# Patient Record
Sex: Male | Born: 1945 | Race: Black or African American | Hispanic: No | Marital: Married | Smoking: Former smoker
Health system: Southern US, Community
[De-identification: ages and names within clinical notes are randomized; demographics above are authoritative.]

## PROBLEM LIST (undated history)

## (undated) DIAGNOSIS — G4733 Obstructive sleep apnea (adult) (pediatric): Secondary | ICD-10-CM

## (undated) DIAGNOSIS — I10 Essential (primary) hypertension: Secondary | ICD-10-CM

## (undated) DIAGNOSIS — E669 Obesity, unspecified: Secondary | ICD-10-CM

## (undated) DIAGNOSIS — R112 Nausea with vomiting, unspecified: Secondary | ICD-10-CM

## (undated) DIAGNOSIS — M1990 Unspecified osteoarthritis, unspecified site: Secondary | ICD-10-CM

## (undated) DIAGNOSIS — Z9889 Other specified postprocedural states: Secondary | ICD-10-CM

## (undated) DIAGNOSIS — H409 Unspecified glaucoma: Secondary | ICD-10-CM

## (undated) DIAGNOSIS — C61 Malignant neoplasm of prostate: Secondary | ICD-10-CM

## (undated) HISTORY — PX: TONSILLECTOMY: SUR1361

## (undated) HISTORY — PX: PROSTATE BIOPSY: SHX241

## (undated) HISTORY — DX: Malignant neoplasm of prostate: C61

## (undated) HISTORY — DX: Obesity, unspecified: E66.9

## (undated) HISTORY — DX: Essential (primary) hypertension: I10

## (undated) HISTORY — DX: Obstructive sleep apnea (adult) (pediatric): G47.33

---

## 1979-05-12 DIAGNOSIS — R112 Nausea with vomiting, unspecified: Secondary | ICD-10-CM

## 1979-05-12 DIAGNOSIS — Z9889 Other specified postprocedural states: Secondary | ICD-10-CM

## 1979-05-12 HISTORY — DX: Other specified postprocedural states: Z98.890

## 1979-05-12 HISTORY — PX: OTHER SURGICAL HISTORY: SHX169

## 1979-05-12 HISTORY — DX: Nausea with vomiting, unspecified: R11.2

## 2000-04-17 ENCOUNTER — Encounter: Admission: RE | Admit: 2000-04-17 | Discharge: 2000-04-17 | Payer: Self-pay | Admitting: Family Medicine

## 2000-04-17 ENCOUNTER — Encounter: Payer: Self-pay | Admitting: Family Medicine

## 2003-06-23 ENCOUNTER — Emergency Department (HOSPITAL_COMMUNITY): Admission: EM | Admit: 2003-06-23 | Discharge: 2003-06-23 | Payer: Self-pay | Admitting: *Deleted

## 2003-07-13 ENCOUNTER — Encounter: Admission: RE | Admit: 2003-07-13 | Discharge: 2003-07-13 | Payer: Self-pay | Admitting: Family Medicine

## 2004-02-28 ENCOUNTER — Ambulatory Visit (HOSPITAL_BASED_OUTPATIENT_CLINIC_OR_DEPARTMENT_OTHER): Admission: RE | Admit: 2004-02-28 | Discharge: 2004-02-28 | Payer: Self-pay | Admitting: Family Medicine

## 2008-07-11 ENCOUNTER — Emergency Department (HOSPITAL_COMMUNITY): Admission: EM | Admit: 2008-07-11 | Discharge: 2008-07-11 | Payer: Self-pay | Admitting: Emergency Medicine

## 2011-06-12 LAB — CBC
Hemoglobin: 15.7
MCV: 90
RBC: 5.22
WBC: 7

## 2011-06-12 LAB — COMPREHENSIVE METABOLIC PANEL
ALT: 23
AST: 31
CO2: 26
Chloride: 98
Creatinine, Ser: 1.02
GFR calc Af Amer: >60
GFR calc non Af Amer: >60
Glucose, Bld: 129 — ABNORMAL HIGH
Sodium: 136
Total Bilirubin: 0.7

## 2011-06-12 LAB — URINALYSIS, ROUTINE W REFLEX MICROSCOPIC
Bilirubin Urine: NEGATIVE
Ketones, ur: 15 — AB
Nitrite: NEGATIVE
Protein, ur: 30 — AB
Urobilinogen, UA: 1

## 2011-06-12 LAB — DIFFERENTIAL
Basophils Absolute: 0.3 — ABNORMAL HIGH
Basophils Relative: 5 — ABNORMAL HIGH
Eosinophils Absolute: 0
Eosinophils Relative: 1

## 2011-06-12 LAB — LIPASE, BLOOD: Lipase: 18

## 2011-08-11 HISTORY — PX: COLONOSCOPY: SHX174

## 2013-08-03 ENCOUNTER — Other Ambulatory Visit: Payer: Self-pay | Admitting: Family Medicine

## 2013-08-03 DIAGNOSIS — Z136 Encounter for screening for cardiovascular disorders: Secondary | ICD-10-CM

## 2013-08-11 ENCOUNTER — Ambulatory Visit
Admission: RE | Admit: 2013-08-11 | Discharge: 2013-08-11 | Disposition: A | Discharge status: Home / Self Care | Payer: 59 | Source: Ambulatory Visit | Attending: Family Medicine | Admitting: Family Medicine

## 2013-08-11 ENCOUNTER — Ambulatory Visit: Payer: Self-pay

## 2013-08-11 DIAGNOSIS — Z136 Encounter for screening for cardiovascular disorders: Secondary | ICD-10-CM

## 2013-09-08 ENCOUNTER — Encounter: Payer: Self-pay | Admitting: Cardiology

## 2013-09-08 ENCOUNTER — Encounter: Payer: Self-pay | Admitting: *Deleted

## 2013-09-08 DIAGNOSIS — E669 Obesity, unspecified: Secondary | ICD-10-CM | POA: Insufficient documentation

## 2013-09-08 DIAGNOSIS — G4733 Obstructive sleep apnea (adult) (pediatric): Secondary | ICD-10-CM | POA: Insufficient documentation

## 2013-09-08 DIAGNOSIS — E7800 Pure hypercholesterolemia, unspecified: Secondary | ICD-10-CM | POA: Insufficient documentation

## 2013-09-08 DIAGNOSIS — I10 Essential (primary) hypertension: Secondary | ICD-10-CM | POA: Insufficient documentation

## 2013-09-09 ENCOUNTER — Encounter: Payer: Self-pay | Admitting: General Surgery

## 2013-09-29 ENCOUNTER — Ambulatory Visit: Payer: Self-pay | Admitting: Cardiology

## 2013-10-08 ENCOUNTER — Ambulatory Visit (INDEPENDENT_AMBULATORY_CARE_PROVIDER_SITE_OTHER): Payer: 59 | Admitting: Cardiology

## 2013-10-08 ENCOUNTER — Encounter: Payer: Self-pay | Admitting: Cardiology

## 2013-10-08 VITALS — BP 148/98 | HR 72 | Ht 68.5 in | Wt 209.0 lb

## 2013-10-08 DIAGNOSIS — I10 Essential (primary) hypertension: Secondary | ICD-10-CM

## 2013-10-08 DIAGNOSIS — E669 Obesity, unspecified: Secondary | ICD-10-CM

## 2013-10-08 NOTE — Progress Notes (Signed)
[place], [place],   [address] Phone: [phone] Fax:  [phone]  Date:  10/08/2013   ID:  Paul Kennedy, DOB 1946/05/04, MRN [id number]  PCP:  Lujean Amel, MD  Sleep Medicine:  Fransico Him, MD  History of Present Illness: Paul Kennedy is a 68 y.o. male with a history of OSA on CPAP, HTN and obesity who presents today for followup.  He is doing well with his CPAP therapy.  He tolerates the CPAP mask but feels the pressure is too high and it causes him to be unable to breathe out.  He is only using it every other day.  He feels rested in the am and has no daytime sleepiness.  He has not been getting much aerobic exercise lately.     Wt Readings from Last 3 Encounters:  10/08/13 209 lb (94.802 kg)  03/26/13 197 lb 6.4 oz (89.54 kg)     Past Medical History  Diagnosis Date  . Hypercholesteremia   . Obesity   . OSA (obstructive sleep apnea)     Mild OSA AHI 13.98/hr now on CPAP at 8cm h2O  . HTN (hypertension)     Current Outpatient Prescriptions  Medication Sig Dispense Refill  . amLODipine (NORVASC) 5 MG tablet Take 5 mg by mouth daily.      Marland Kitchen aspirin 81 MG tablet Take 81 mg by mouth daily.      . B Complex Vitamins (VITAMIN B COMPLEX PO) Take 1 tablet by mouth daily.      . Coenzyme Q10 (COQ10) 100 MG CAPS Take 1 capsule by mouth daily.      . hydrochlorothiazide (HYDRODIURIL) 25 MG tablet Take 25 mg by mouth daily.      . Multiple Vitamin (MULTIVITAMIN) capsule Take 1 capsule by mouth daily.      . NON FORMULARY GAMMA E TOCOPHEROL, ULTRA NATURAL PROSTATE, MAXI HAIR, BUFFERED VITAMIN C      . Omega-3 Fatty Acids (FISH OIL PO) Take 1 tablet by mouth daily.      . potassium chloride SA (K-DUR,KLOR-CON) 20 MEQ tablet Take 20 mEq by mouth 2 (two) times daily.      . TRAVATAN Z 0.004 % SOLN ophthalmic solution Place 1 drop into both eyes at bedtime.       Marland Kitchen VIAGRA 100 MG tablet Take 100 mg by mouth as needed.       . Vitamin D, Cholecalciferol, 1000 UNITS  TABS Take 1,000 Units by mouth daily.      . vitamin E 100 UNIT capsule Take 100 Units by mouth daily.       No current facility-administered medications for this visit.    Allergies:   No Known Allergies  Social History:  The patient  reports that he quit smoking about 27 years ago. His smoking use included Cigarettes. He smoked 0.00 packs per day. He does not have any smokeless tobacco history on file. He reports that he does not drink alcohol or use illicit drugs.   Family History:  The patient's family history includes Diabetes in his mother, sister, and sister; Hypertension in his mother and sister.   ROS:  Please see the history of present illness.      All other systems reviewed and negative.   PHYSICAL EXAM: VS:  BP 148/98  Pulse 72  Ht 5' 8.5" (1.74 m)  Wt 209 lb (94.802 kg)  BMI 31.31 kg/m2 Well nourished, well developed, in no acute distress HEENT: normal Neck: no  JVD Cardiac:  normal S1, S2; RRR; no murmur Lungs:  clear to auscultation bilaterally, no wheezing, rhonchi or rales Abd: soft, nontender, no hepatomegaly Ext: no edema Skin: warm and dry Neuro:  CNs 2-12 intact, no focal abnormalities noted       ASSESSMENT AND PLAN:  1. OSA on CPAP therapy and is tolerating well  - I will get a 2 week autotitration from 4 to 20cm H2O since he is complaining that his pressure is too high 2. HTN - elevated today  - amlodipine/HCTZ   - I have asked him to check his BP daily for a week and call with the results 3. Obesity   Followup with me in  6 months  Signed, Fransico Him, MD 10/08/2013 2:08 PM

## 2013-10-08 NOTE — Patient Instructions (Addendum)
Your physician recommends that you continue on your current medications as directed. Please refer to the Current Medication list given to you today.  We contacted [facility] and they will be calling you to get a download on you machine. We also sent the order in for a two week auto titration on your CPAP machine.  Your physician has requested that you regularly monitor and record your blood pressure readings at home. Please use the same machine at the same time of day to check your readings and record them for a week and call us with the results.   Your physician wants you to follow-up in: 6 Months with Dr Mallie Snooks will receive a reminder letter in the mail two months in advance. If you don't receive a letter, please call our office to schedule the follow-up appointment.

## 2013-11-19 ENCOUNTER — Encounter: Payer: Self-pay | Admitting: Cardiology

## 2013-11-27 ENCOUNTER — Encounter: Payer: Self-pay | Admitting: Cardiology

## 2013-11-30 ENCOUNTER — Other Ambulatory Visit: Payer: Self-pay | Admitting: General Surgery

## 2013-11-30 DIAGNOSIS — G4733 Obstructive sleep apnea (adult) (pediatric): Secondary | ICD-10-CM

## 2014-11-24 ENCOUNTER — Other Ambulatory Visit (HOSPITAL_COMMUNITY): Payer: Self-pay | Admitting: Urology

## 2014-11-24 DIAGNOSIS — C61 Malignant neoplasm of prostate: Secondary | ICD-10-CM

## 2014-12-03 ENCOUNTER — Encounter (HOSPITAL_COMMUNITY)
Admission: RE | Admit: 2014-12-03 | Discharge: 2014-12-03 | Disposition: A | Discharge status: Home / Self Care | Payer: 59 | Source: Ambulatory Visit | Attending: Urology | Admitting: Urology

## 2014-12-03 DIAGNOSIS — C61 Malignant neoplasm of prostate: Secondary | ICD-10-CM | POA: Diagnosis present

## 2014-12-03 MED ORDER — TECHNETIUM TC 99M MEDRONATE IV KIT
26.0000 | PACK | Freq: Once | INTRAVENOUS | Status: AC | PRN
  Administered 2014-12-03: 26 via INTRAVENOUS

## 2014-12-14 ENCOUNTER — Ambulatory Visit (HOSPITAL_COMMUNITY)
Admission: RE | Admit: 2014-12-14 | Discharge: 2014-12-14 | Disposition: A | Discharge status: Home / Self Care | Payer: 59 | Source: Ambulatory Visit | Attending: Urology | Admitting: Urology

## 2014-12-14 ENCOUNTER — Other Ambulatory Visit (HOSPITAL_COMMUNITY): Payer: Self-pay | Admitting: Urology

## 2014-12-14 DIAGNOSIS — N403 Nodular prostate with lower urinary tract symptoms: Principal | ICD-10-CM

## 2014-12-14 DIAGNOSIS — Z8546 Personal history of malignant neoplasm of prostate: Secondary | ICD-10-CM | POA: Diagnosis not present

## 2014-12-14 DIAGNOSIS — M545 Low back pain: Secondary | ICD-10-CM | POA: Insufficient documentation

## 2014-12-14 DIAGNOSIS — N138 Other obstructive and reflux uropathy: Secondary | ICD-10-CM

## 2015-01-06 ENCOUNTER — Telehealth: Payer: Self-pay | Admitting: Medical Oncology

## 2015-01-06 NOTE — Telephone Encounter (Signed)
I called pt to introduce myself as the Prostate Nurse Navigator and the Coordinator of the Prostate [place].  1. I confirmed with the patient he is aware of his referral to the clinic 12/22/2014 arriving at 7:45am.  2. I discussed the format of the clinic and the physicians he will be seeing that day.  3. I discussed where the clinic is located and how to contact me. I discussed our [place] parking services.  4. I confirmed his address and informed him I would be mailing a packet of information and forms to be completed. I asked him to bring them with him the day of his appointment.   He voiced understanding of the above. I asked him to call me if he has any questions or concerns regarding his appointments or the forms he needs to complete.

## 2015-01-06 NOTE — Telephone Encounter (Signed)
I left a message asking Paul Kennedy to call me regarding his referral to the [facility] 12/22/2014.   Cira Rue, RN, BSN, [place]  [phone] Fax [phone]

## 2015-01-14 DIAGNOSIS — C61 Malignant neoplasm of prostate: Secondary | ICD-10-CM | POA: Insufficient documentation

## 2015-01-14 NOTE — Progress Notes (Signed)
Radiation Oncology         [phone] ________________________________  Multidisciplinary Prostate Cancer Clinic  Initial Radiation Oncology Consultation  Name: Paul Kennedy MRN: [id number]  Date: 01/21/2015  DOB: [birth year]  BZ:JIRCVEL,FYBOF, MD  Raynelle Bring, MD   REFERRING PHYSICIAN: Raynelle Bring, MD  DIAGNOSIS: 69 y.o. gentleman with stage T1c adenocarcinoma of the prostate with a Gleason's score of 4+4 and a PSA of 5.8    ICD-9-CM ICD-10-CM   1. Malignant neoplasm of prostate 185 C61     HISTORY OF PRESENT ILLNESS::Paul Kennedy is a 69 y.o. gentleman.  He was noted to have an elevated PSA of 5.8 on 09/27/14 by his primary care physician, Dr. Gracelyn Nurse.  Accordingly, he was referred for evaluation in urology by Dr. Junious Silk,  digital rectal examination was performed at that time revealing no nodule.  The patient proceeded to transrectal ultrasound with 12 biopsies of the prostate on 11/12/14.  The prostate volume measured 26 cc.  Out of 12 core  biopsies,4 were positive.  The maximum Gleason score was 4+4, and this was seen in the distribution below.    The patient reviewed the biopsy results with his urologist and he has kindly been referred today to the multidisciplinary prostate cancer clinic for presentation of pathology and radiology studies in our conference for discussion of potential radiation treatment options and clinical evaluation.  PREVIOUS RADIATION THERAPY: No  PAST MEDICAL HISTORY:  has a past medical history of Hypercholesteremia; Obesity; OSA (obstructive sleep apnea); HTN (hypertension); and Prostate cancer.    PAST SURGICAL HISTORY: Past Surgical History  Procedure Laterality Date  . Colonoscopy  08/2011    repeat in 5 year,2 tubular adenomas-Dr Michail Sermon  . Prostate biopsy      FAMILY HISTORY: family history includes Diabetes in his mother, sister, and sister; Hypertension in his mother and sister.  SOCIAL HISTORY:  reports that he quit smoking about 28  years ago. His smoking use included Cigarettes. He does not have any smokeless tobacco history on file. He reports that he does not drink alcohol or use illicit drugs.  ALLERGIES: Review of patient's allergies indicates no known allergies.  MEDICATIONS:  Current Outpatient Prescriptions  Medication Sig Dispense Refill  . amLODipine (NORVASC) 5 MG tablet Take 5 mg by mouth daily.    Marland Kitchen aspirin 81 MG tablet Take 81 mg by mouth daily.    . B Complex Vitamins (VITAMIN B COMPLEX PO) Take 1 tablet by mouth daily.    . Coenzyme Q10 (COQ10) 100 MG CAPS Take 1 capsule by mouth daily.    . hydrochlorothiazide (HYDRODIURIL) 25 MG tablet Take 25 mg by mouth daily.    . Multiple Vitamin (MULTIVITAMIN) capsule Take 1 capsule by mouth daily.    . NON FORMULARY GAMMA E TOCOPHEROL, ULTRA NATURAL PROSTATE, MAXI HAIR, BUFFERED VITAMIN C    . Omega-3 Fatty Acids (FISH OIL PO) Take 1 tablet by mouth daily.    . potassium chloride SA (K-DUR,KLOR-CON) 20 MEQ tablet Take 20 mEq by mouth 2 (two) times daily.    . TRAVATAN Z 0.004 % SOLN ophthalmic solution Place 1 drop into both eyes at bedtime.     Marland Kitchen VIAGRA 100 MG tablet Take 100 mg by mouth as needed.     . Vitamin D, Cholecalciferol, 1000 UNITS TABS Take 1,000 Units by mouth daily.    . vitamin E 100 UNIT capsule Take 100 Units by mouth daily.     No current facility-administered medications for this encounter.  REVIEW OF SYSTEMS:  A 15 point review of systems is documented in the electronic medical record. This was obtained by the nursing staff. However, I reviewed this with the patient to discuss relevant findings and make appropriate changes.  A comprehensive review of systems was negative..  The patient completed an IPSS and IIEF questionnaire.    PHYSICAL EXAM: This patient is in no acute distress.  He is alert and oriented.   vitals were not taken for this visit. He exhibits no respiratory distress or labored breathing.  He appears neurologically intact.   His mood is pleasant.  His affect is appropriate.  Please note the digital rectal exam findings described above.  KPS = 100  100 - Normal; no complaints; no evidence of disease. 90   - Able to carry on normal activity; minor signs or symptoms of disease. 80   - Normal activity with effort; some signs or symptoms of disease. 80   - Cares for self; unable to carry on normal activity or to do active work. 60   - Requires occasional assistance, but is able to care for most of his personal needs. 50   - Requires considerable assistance and frequent medical care. 40   - Disabled; requires special care and assistance. 70   - Severely disabled; hospital admission is indicated although death not imminent. 57   - Very sick; hospital admission necessary; active supportive treatment necessary. 10   - Moribund; fatal processes progressing rapidly. 0     - Dead  Karnofsky DA, Abelmann [place], Craver LS and Burchenal [facility] [phone]) The use of the nitrogen mustards in the palliative treatment of carcinoma: with particular reference to bronchogenic carcinoma Cancer 1 634-56   LABORATORY DATA:  Lab Results  Component Value Date   WBC 7.0 07/11/2008   HGB 15.7 07/11/2008   HCT 47.0 07/11/2008   MCV 90.0 07/11/2008   PLT 221 07/11/2008   Lab Results  Component Value Date   NA 136 07/11/2008   K 3.2* 07/11/2008   CL 98 07/11/2008   CO2 26 07/11/2008   Lab Results  Component Value Date   ALT 23 07/11/2008   AST 31 07/11/2008   ALKPHOS 79 07/11/2008   BILITOT 0.7 07/11/2008     RADIOGRAPHY: No results found.    IMPRESSION: This gentleman is a 69 y.o. gentleman with stage T1c adenocarcinoma of the prostate with a Gleason's score of 4+4 and a PSA of 5.8.  His T-Stage, Gleason's Score, and PSA put him into the high risk group.  Accordingly he is eligible for a variety of potential treatment options including prostatectomy or radiotherapy with hormone therapy for 2 to 3 years.  PLAN:Today I reviewed the  findings and workup thus far.  We discussed the natural history of prostate cancer.  We reviewed the the implications of T-stage, Gleason's Score, and PSA on decision-making and outcomes in prostate cancer.  We discussed radiation treatment in the management of prostate cancer with regard to the logistics and delivery of external beam radiation treatment as well as the logistics and delivery of prostate brachytherapy.  We compared and contrasted each of these approaches and also compared these against prostatectomy.  The patient expressed interest in external beam radiotherapy.  I filled out a patient counseling form for him with relevant treatment diagrams and we retained a copy for our records.   The patient remains undecided, but, maybe leaning toward prostatectomy.  I will share my findings with Dr. Alinda Money and look  forward to following his decision and result in the future.     I enjoyed meeting with him today, and will look forward to participating in the care of this very nice gentleman.  I spent 40 minutes face to face with the patient and more than 50% of that time was spent in counseling and/or coordination of care.   This document serves as a record of services personally performed by Tyler Pita, MD. It was created on his behalf by Jeralene Peters, a trained medical scribe. The creation of this record is based on the scribe's personal observations and the provider's statements to them. This document has been checked and approved by the attending provider.      ------------------------------------------------  Sheral Apley Tammi Klippel, M.D.

## 2015-01-18 ENCOUNTER — Encounter: Payer: Self-pay | Admitting: Radiation Oncology

## 2015-01-18 NOTE — Progress Notes (Signed)
GU Location of Tumor / Histology: prostatic adenocarcinoma  If Prostate Cancer, Gleason Score is (4 + 4) and PSA is (5.80)  Kemon Devincenzi has a history of a right orchiectomy for undescended testicle in November 2011 the, erectile dysfunction improved with Viagra in March 2014. PSA March 2014 2.72.  Biopsies of prostate (if applicable) revealed:    Past/Anticipated interventions by urology, if any: right orchiectomy in 2011, prostate biopsy, and referral to [facility]  Past/Anticipated interventions by medical oncology, if any: no  Weight changes, if any: no  Bowel/Bladder complaints, if any: erectile dysfunction; denies any bothersome lower urinary tract symptoms   Nausea/Vomiting, if any: no  Pain issues, if any:  no  SAFETY ISSUES:  Prior radiation? no  Pacemaker/ICD? no  Possible current pregnancy? no  Is the patient on methotrexate? no  Current Complaints / other details:  69 year old male. Married with one daughter. Works as a Librarian, academic. Prostate volume 25.55cc.

## 2015-01-20 ENCOUNTER — Telehealth: Payer: Self-pay | Admitting: Medical Oncology

## 2015-01-20 NOTE — Telephone Encounter (Signed)
I left a message with Paul Kennedy to asking him to return my call to confirm his appointment for the Prostate [facility] 5/13 arriving at 7:30am. I asked him to bring his completed medical forms and reviewed where we are located.    Cira Rue, RN, BSN, [place]  [phone] Fax [phone]

## 2015-01-20 NOTE — Telephone Encounter (Signed)
Mr. Gingras called back to confirm he will be here for his appointment 5/13 at 7:30am.   Cira Rue, RN, BSN, [place]  [phone] Fax [phone]

## 2015-01-21 ENCOUNTER — Encounter: Payer: Self-pay | Admitting: *Deleted

## 2015-01-21 ENCOUNTER — Ambulatory Visit (HOSPITAL_BASED_OUTPATIENT_CLINIC_OR_DEPARTMENT_OTHER): Payer: 59 | Admitting: Oncology

## 2015-01-21 ENCOUNTER — Encounter: Payer: Self-pay | Admitting: Radiation Oncology

## 2015-01-21 ENCOUNTER — Encounter: Payer: Self-pay | Admitting: Medical Oncology

## 2015-01-21 ENCOUNTER — Ambulatory Visit
Admission: RE | Admit: 2015-01-21 | Discharge: 2015-01-21 | Disposition: A | Discharge status: Home / Self Care | Payer: 59 | Source: Ambulatory Visit | Attending: Radiation Oncology | Admitting: Radiation Oncology

## 2015-01-21 VITALS — BP 153/89 | HR 85 | Resp 16 | Ht 68.0 in | Wt 215.2 lb

## 2015-01-21 DIAGNOSIS — C61 Malignant neoplasm of prostate: Secondary | ICD-10-CM | POA: Diagnosis not present

## 2015-01-21 HISTORY — DX: Unspecified glaucoma: H40.9

## 2015-01-21 HISTORY — DX: Unspecified osteoarthritis, unspecified site: M19.90

## 2015-01-21 NOTE — Progress Notes (Signed)
Retired. Married to [employer]. One child. Wears glasses. Sleeps on three or more pillows.

## 2015-01-21 NOTE — Progress Notes (Signed)
Please see consult note.  

## 2015-01-21 NOTE — Consult Note (Signed)
Reason for Referral: Prostate cancer.   HPI: [name] is a pleasant 69 year old healthy gentleman without any significant comorbid conditions. He was found to have an elevated PSA 5.89 on routine PSA screening. He was subsequently referred to Dr. Junious Silk and underwent a biopsy on 11/15/2014 and showed a Gleason score of 4+4 = 8 and 5% of the right base core. He also had a Gleason score 4+3 = 7 in 2 cores and 3+3 = 6 in 1 core. Staging workup including CT scan of the bone scan without any evidence of metastasis. He does not report any major urinary symptoms at this time. Does not report any frequency urgency or hesitancy. He does not report any skeletal complaints. He is relatively active and continue be in good health and shape. Does not report any headaches or blurry vision or syncope or seizures. He does not report any fevers or chills or sweats. He does not report any abdominal pain, constipation or diarrhea. He he does report some element of sexual dysfunction and uses Viagra. He does not report any skeletal complaints. Remainder review of systems unremarkable.   Past Medical History  Diagnosis Date  . Hypercholesteremia   . Obesity   . OSA (obstructive sleep apnea)     Mild OSA AHI 13.98/hr now on CPAP at 8cm h2O  . HTN (hypertension)   . Prostate cancer   :  Past Surgical History  Procedure Laterality Date  . Colonoscopy  08/2011    repeat in 5 year,2 tubular adenomas-Dr Michail Sermon  . Prostate biopsy    :   Current outpatient prescriptions:  .  amLODipine (NORVASC) 5 MG tablet, Take 5 mg by mouth daily., Disp: , Rfl:  .  aspirin 81 MG tablet, Take 81 mg by mouth daily., Disp: , Rfl:  .  B Complex Vitamins (VITAMIN B COMPLEX PO), Take 1 tablet by mouth daily., Disp: , Rfl:  .  Coenzyme Q10 (COQ10) 100 MG CAPS, Take 1 capsule by mouth daily., Disp: , Rfl:  .  hydrochlorothiazide (HYDRODIURIL) 25 MG tablet, Take 25 mg by mouth daily., Disp: , Rfl:  .  Multiple Vitamin (MULTIVITAMIN)  capsule, Take 1 capsule by mouth daily., Disp: , Rfl:  .  NON FORMULARY, GAMMA E TOCOPHEROL, ULTRA NATURAL PROSTATE, MAXI HAIR, BUFFERED VITAMIN C, Disp: , Rfl:  .  Omega-3 Fatty Acids (FISH OIL PO), Take 1 tablet by mouth daily., Disp: , Rfl:  .  potassium chloride SA (K-DUR,KLOR-CON) 20 MEQ tablet, Take 20 mEq by mouth 2 (two) times daily., Disp: , Rfl:  .  TRAVATAN Z 0.004 % SOLN ophthalmic solution, Place 1 drop into both eyes at bedtime. , Disp: , Rfl:  .  VIAGRA 100 MG tablet, Take 100 mg by mouth as needed. , Disp: , Rfl:  .  Vitamin D, Cholecalciferol, 1000 UNITS TABS, Take 1,000 Units by mouth daily., Disp: , Rfl:  .  vitamin E 100 UNIT capsule, Take 100 Units by mouth daily., Disp: , Rfl: :  No Known Allergies:  Family History  Problem Relation Age of Onset  . Hypertension Mother   . Diabetes Mother     Type II  . Hypertension Sister   . Diabetes Sister     type II  . Diabetes Sister   :  History   Social History  . Marital Status: Married    Spouse Name: N/A  . Number of Children: N/A  . Years of Education: N/A   Occupational History  . Not on  file.   Social History Main Topics  . Smoking status: Former Smoker    Types: Cigarettes    Quit date: 09/10/1986  . Smokeless tobacco: Not on file  . Alcohol Use: No  . Drug Use: No  . Sexual Activity: Yes   Other Topics Concern  . Not on file   Social History Narrative  :  Pertinent items are noted in HPI.  Exam: ECOG 0 There were no vitals taken for this visit. General appearance: alert and cooperative Head: Normocephalic, without obvious abnormality Nose: Nares normal. Septum midline. Mucosa normal. No drainage or sinus tenderness. Throat: lips, mucosa, and tongue normal; teeth and gums normal Neck: no adenopathy Back: negative Resp: clear to auscultation bilaterally Chest wall: no tenderness Cardio: regular rate and rhythm, S1, S2 normal, no murmur, click, rub or gallop GI: soft, non-tender; bowel  sounds normal; no masses,  no organomegaly Extremities: extremities normal, atraumatic, no cyanosis or edema Pulses: 2+ and symmetric Skin: Skin color, texture, turgor normal. No rashes or lesions    Assessment and Plan:    69 year old gentleman with the prostate cancer diagnosed in March 2016. His PSA was 5.89 and a Gleason score 4+4 = 8. His clinical stage is T1c. His imaging workup was unrevealing for measurable disease. His case was discussed today in the prostate cancer multidisciplinary clinic after reviewing his pathology and radiology images. Options would include surgical therapy with radical prostatectomy versus external beam radiation with hormone therapy. The role of hormone therapy was discussed today extensively with the patient given the fact that he is leaning towards radiation. Androgen deprivation in the form of Lupron was discussed the duration would be around 2 years given the fact that he has high risk intermediate category. Complications associated with androgen deprivation would include hot flashes, weight gain, osteoporosis, erectile dysfunction among others. He is very concerned about having surgery and would like to reported visual possible. He'll have a discussion again with Dr. Alinda Money today to discuss surgery further. He will also discuss radiation option with Dr. Tammi Klippel.

## 2015-01-21 NOTE — Addendum Note (Signed)
Encounter addended by: Raynelle Bring, MD on: 01/21/2015 12:43 PM<BR>     Documentation filed: Clinical Notes

## 2015-01-21 NOTE — Progress Notes (Signed)
[facility]  Prostate Clinic Psychosocial Distress Screening Clinical Social Work  Clinical Social Work met with pt and his wife, Paul Kennedy at [facility] to offer support, introduce self and review distress screening protocol. The patient scored a 1  on the Psychosocial Distress Thermometer which indicates no distress. Pt and wife live together in their home. Pt shared he has been eager to lose weight and a healthy diet is a concern. He tries to manage this by walking. CSW also shared the options of the [place] [facility] through the [facility]. Pt and wife plan to look into this resource. CSW notified RN of these concerns. CSW reviewed options for additional support; Prostate Support Group and Pt & Family Support Team. CSW discussed common emotions patients experience and coping techniques. Pt aware to reach out to CSW as needed and was provided contact information.   Clinical Social Worker follow up needed: No.  If yes, follow up plan:  Loren Racer, [place]  [facility] Phone: [phone] Fax: [phone]

## 2015-01-21 NOTE — Consult Note (Signed)
Chief Complaint  Prostate Cancer   Reason For Visit  Reason for consult: To discuss treatment options for prostate cancer and specifically to consider robotic surgery. Physician requesting consult: Dr. Eda Keys PCP: Dr. Burnadette Pop Location of consult: [facility]   History of Present Illness       Paul Kennedy is a 69 year old with a history of hypertension, sleep apnea, and glaucoma who was found to have an elevated PSA of 5.8 prompting a prostate needle biopsy by Dr. Junious Silk on 11/12/14.  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 He has undergone staging studies including a CT scan of the abdomen and pelvis on 12/01/13 that was negative for metastatic disease and a bone scan on 12/03/14 that showed uptake of the radiotracer in multiple ribs, thoracic spine, lumbar spine, and bilateral inferior pubic rami.  Plain films of the thoracic and lumbar spine suggested degenerative changes and plain films of the pelvis that were not concerning for metastatic disease.  He recently had his PSA rechecked and was 9.05 although was checked only about a month after his biopsy.  He does have a family history of prostate cancer with both his maternal grandfather and uncle having had prostate cancer.  Of note, he also informs me that although he did not put this on his history form, he has been using an over-the-counter form of oral testosterone and transdermal testosterone since 2015.  This has provided quality of life benefit with regard to his energy level and libido.  He has not undergone laboratory testing and has not been taking this under the direction of the healthcare provider.  TNM stage: cT1c N0 M0 PSA: 5.8 (9.05 when rechecked after his biopsy) Gleason score: 4+4=8 Biopsy (11/12/14): 4/12 cores positive    Left: L lateral mid (5%, 3+3=6), L mid (20%, 3+4=7), L base (70%,  4+3=7)    Right: R base (5%, 4+4=8) Prostate volume: 25.6 cc  Nomogram OC disease: 26% EPE: 70% SVI: 15% LNI:16% PFS (surgery): 50% at 5 years, 35% at 10 years  Urinary function: He denies significant lower urinary tract symptoms.  IPSS is 1. 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  SHIM score is 17.  He can reliably obtain an erection adequate for intercourse with Viagra 100 mg.  However, he does have difficulty obtaining an adequate erection without Viagra.   Past Medical History  1. History of Arthritis  2. History of glaucoma (Z86.69)  3. History of hypertension (Z86.79)  4. History of sleep apnea (Z87.09)   He does use CPAP.   Surgical History  1. History of Colonoscopy (Fiberoptic)  2. History of Orchiectomy Right  3. History of Tonsillectomy  Current Meds  1. Aspirin 81 MG TABS;  Therapy: (Recorded:29Sep2009) to Recorded  2. Hydrochlorothiazide 25 MG Oral Tablet;  Therapy: (Recorded:29Sep2009) to Recorded  3. Klor-Con M20 20 MEQ Oral Tablet Extended Release;  Therapy: [id number] to Recorded  4. Travatan Z 0.004 % Ophthalmic Solution;  Therapy: [id number] to Recorded  Allergies  1. No Known Drug Allergies  Family History  1. Family history of Death In The Family Father : Father  2. Family history of Diabetes Mellitus : Mother  3. Family history of Diabetes Mellitus : Sister  4. Family history of Family Health Status - Mother's Age : Mother  5. Family history of Family Health Status Number Of Children  6. Family  history of Hypertension : Mother  Social History   Denied: History of Alcohol Use   Former smoker [phone])   Marital History - Currently Married   Occupation:  Review of Systems AU Complete-Male: Genitourinary, constitutional, skin, eye, otolaryngeal, hematologic/lymphatic, cardiovascular, pulmonary, endocrine, musculoskeletal, gastrointestinal, neurological and psychiatric system(s) were reviewed and pertinent findings if  present are noted and are otherwise negative.  Constitutional: no night sweats and no recent weight loss.  Cardiovascular: no chest pain.  Respiratory: no shortness of breath.    Physical Exam Constitutional: Well nourished and well developed . No acute distress.  ENT:. The ears and nose are normal in appearance.  Neck: The appearance of the neck is normal and no neck mass is present.  Pulmonary: No respiratory distress, normal respiratory rhythm and effort and clear bilateral breath sounds.  Cardiovascular: Heart rate and rhythm are normal . No peripheral edema.  Abdomen: The abdomen is soft and nontender. No masses are palpated. No CVA tenderness. No hernias are palpable. No hepatosplenomegaly noted.  Rectal: Rectal exam demonstrates normal sphincter tone, no tenderness and no masses. Prostate size is estimated to be 35 g. The prostate has no nodularity and is not tender. The left seminal vesicle is nonpalpable. The right seminal vesicle is nonpalpable. The perineum is normal on inspection.  Lymphatics: The femoral and inguinal nodes are not enlarged or tender.  Skin: Normal skin turgor, no visible rash and no visible skin lesions.  Neuro/Psych:. Mood and affect are appropriate.    Results/Data  I have independently reviewed his medical records, PSA results, and pathology report.  We have reviewed his pathology slides today.  Although he has high-grade disease, he appears to have relatively low volume disease.  The areas of discontinuous disease measured at 70% actually shows only a very small volume of disease in multiple places indicating low volume disease even in this particular biopsy.  We also reviewed his imaging studies including bone scan, CT scan, and plain film images.  Findings are most consistent with multiple degenerative changes/trauma of the thoracic and lumbar spine, pelvis, and ribs.  He has not felt to be at high suspicion for having metastatic disease.     Assessment  1.  Prostate cancer (C61)  Discussion/Summary  1.  High risk, localized prostate cancer: I had a long and detailed discussion with Paul Kennedy and his wife today.  I did recommend therapy of curative intent and an somewhat optimistic about his chance for cure considering his high-grade but low volume disease.  I did recommend that he consider aggressive therapy with either surgery or IMRT in combination with long-term androgen deprivation therapy.  We have reviewed the pros and cons of each approach today in detail.  He has seen Dr. Alen Blew and is scheduled to see Dr. Tammi Klippel later this morning to further discuss radiotherapy.   The patient was counseled about the natural history of prostate cancer and the standard treatment options that are available for prostate cancer. It was explained to him how his age and life expectancy, clinical stage, Gleason score, and PSA affect his prognosis, the decision to proceed with additional staging studies, as well as how that information influences recommended treatment strategies. We discussed the roles for active surveillance, radiation therapy, surgical therapy, androgen deprivation, as well as ablative therapy options for the treatment of prostate cancer as appropriate to his individual cancer situation. We discussed the risks and benefits of these options with regard to their impact on cancer control and also in  terms of potential adverse events, complications, and impact on quiality of life particularly related to urinary, bowel, and sexual function. The patient was encouraged to ask questions throughout the discussion today and all questions were answered to his stated satisfaction. In addition, the patient was provided with and/or directed to appropriate resources and literature for further education about prostate cancer and treatment options.   We discussed surgical therapy for prostate cancer including the different available surgical approaches. We discussed, in  detail, the risks and expectations of surgery with regard to cancer control, urinary control, and erectile function as well as the expected postoperative recovery process. Additional risks of surgery including but not limited to bleeding, infection, hernia formation, nerve damage, lymphocele formation, bowel/rectal injury potentially necessitating colostomy, damage to the urinary tract resulting in urine leakage, urethral stricture, and the cardiopulmonary risks such as myocardial infarction, stroke, death, venothromboembolism, etc. were explained. The risk of open surgical conversion for robotic/laparoscopic prostatectomy was also discussed.   We also discussed his testosterone replacement therapy that he has been taking with over-the-counter supplements.  I did recommend that he stop the supplements currently.  We discussed the potential risks and benefits of testosterone replacement therapy after definitive treatment of prostate cancer.  He understands that this is something we can potentially consider depending on the effects related to his quality of life.  He understands that it may be more uncertain as far as the safety of testosterone replacement therapy in the setting of past radiation therapy especially when we're utilizing androgen deprivation as a component of therapy.  He will consider this with his decision.  We did discuss sexual function in detail today.  He understands that there would be a very high risk of developing erectile dysfunction and would be refractory to oral medical therapy based on his current erectile function.  He has a very good understanding of the situation and his treatment options.  He will consider his options and let us know how he would like to proceed.  If he proceeds with surgical treatment, I would plan to proceed with a bilateral nerve sparing robot-assisted left scopic radical prostatectomy and bilateral pelvic lymphadenectomy.  Cc: Dr. Burnadette Pop Dr. Tyler Pita Dr. Zola Button Dr. Festus Aloe  A total of 70 minutes were spent in the overall care of the patient today with 55 minutes in direct face to face consultation.    Signatures Electronically signed by : Raynelle Bring, M.D.; Jan 21 2015 12:41PM EST Electronically signed by : Raynelle Bring, M.D.; Jan 21 2015 12:42PM EST

## 2015-01-21 NOTE — Addendum Note (Signed)
Encounter addended by: Heywood Footman, RN on: 01/21/2015  3:50 PM<BR>     Documentation filed: Chief Complaint Section, Vitals Section, Flowsheet VN, Notes Section, Demographics Visit, Charges VN, Medications

## 2015-01-21 NOTE — [facility] Oncology Navigator Note (Signed)
                               Care Plan Summary  Name: Paul Kennedy DOB: 1946/05/06   Your Medical Team:   Urologist -  Dr. Raynelle Bring, [facility]  Radiation Oncologist - Dr. Tyler Pita, [facility]   Medical Oncologist - Dr. Zola Button, [place]  Recommendations: 1) Androgen Deprivation Therapy ( Hormone Therapy) 2) Radiation 3) Surgery  * These recommendations are based on information available as of today's consult.      Recommendations may change depending on the results of further tests or exams.  Next Steps: 1) Patient will consider his options and call Robin with his decision.    When appointments need to be scheduled, you will be contacted by [facility] and/or [facility].  Questions?  Please do not hesitate to call Cira Rue, RN, BSN, CRNI at [phone] any questions or concerns.  Shirlean Mylar is your Oncology Nurse Navigator and is available to assist you while you're receiving your medical care at [facility].

## 2015-01-24 ENCOUNTER — Telehealth: Payer: Self-pay | Admitting: Medical Oncology

## 2015-01-24 NOTE — Telephone Encounter (Signed)
Paul Kennedy called this morning to inform me that he has decided he would like to have the robotic prostatectomy. He would like to have the week of June 6-10 or June 13-17. I explained that Paul Kennedy office will schedule his surgery but I will let Paul Kennedy know of his decision and the dates he has requested. He voiced understanding. I forwarded a message to Paul Kennedy with the above information.

## 2015-01-25 ENCOUNTER — Other Ambulatory Visit: Payer: Self-pay | Admitting: Urology

## 2015-02-08 ENCOUNTER — Encounter (HOSPITAL_COMMUNITY): Payer: Self-pay

## 2015-02-08 ENCOUNTER — Encounter (HOSPITAL_COMMUNITY)
Admission: RE | Admit: 2015-02-08 | Discharge: 2015-02-08 | Disposition: A | Discharge status: Home / Self Care | Payer: Medicare Other | Source: Ambulatory Visit | Attending: Urology | Admitting: Urology

## 2015-02-08 ENCOUNTER — Ambulatory Visit (HOSPITAL_COMMUNITY)
Admission: RE | Admit: 2015-02-08 | Discharge: 2015-02-08 | Disposition: A | Discharge status: Home / Self Care | Payer: Medicare Other | Source: Ambulatory Visit | Attending: Urology | Admitting: Urology

## 2015-02-08 ENCOUNTER — Other Ambulatory Visit: Payer: Self-pay

## 2015-02-08 DIAGNOSIS — C61 Malignant neoplasm of prostate: Secondary | ICD-10-CM | POA: Insufficient documentation

## 2015-02-08 DIAGNOSIS — Z01818 Encounter for other preprocedural examination: Secondary | ICD-10-CM

## 2015-02-08 DIAGNOSIS — Z01811 Encounter for preprocedural respiratory examination: Secondary | ICD-10-CM

## 2015-02-08 HISTORY — DX: Other specified postprocedural states: Z98.890

## 2015-02-08 HISTORY — DX: Nausea with vomiting, unspecified: R11.2

## 2015-02-08 LAB — CBC
HCT: 39.8 % (ref 39.0–52.0)
Hemoglobin: 13.6 g/dL (ref 13.0–17.0)
MCH: 29.5 pg (ref 26.0–34.0)
MCHC: 34.2 g/dL (ref 30.0–36.0)
MCV: 86.3 fL (ref 78.0–100.0)
PLATELETS: 222 10*3/uL (ref 150–400)
RBC: 4.61 MIL/uL (ref 4.22–5.81)
RDW: 13.6 % (ref 11.5–15.5)
WBC: 4.5 10*3/uL (ref 4.0–10.5)

## 2015-02-08 LAB — BASIC METABOLIC PANEL
Anion gap: 10 (ref 5–15)
BUN: 18 mg/dL (ref 6–20)
CO2: 27 mmol/L (ref 22–32)
Calcium: 9.7 mg/dL (ref 8.9–10.3)
Chloride: 104 mmol/L (ref 101–111)
Creatinine, Ser: 1.13 mg/dL (ref 0.61–1.24)
Glucose, Bld: 108 mg/dL — ABNORMAL HIGH (ref 65–99)
Potassium: 3.3 mmol/L — ABNORMAL LOW (ref 3.5–5.1)
Sodium: 141 mmol/L (ref 135–145)

## 2015-02-08 LAB — ABO/RH: ABO/RH(D): O POS

## 2015-02-08 NOTE — Patient Instructions (Addendum)
Paul Kennedy  02/08/2015   Your procedure is scheduled on: Thursday February 10, 2015  Report to [facility] Main  Entrance and follow signs to               [place] at 515  AM.  Call this number if you have problems the morning of surgery [phone]  BRING CPAP MASK AND TUBING   Remember: ONLY 1 PERSON MAY GO WITH YOU TO SHORT STAY TO GET  READY MORNING OF [place].  Do not eat food :After Midnight Tuesday night, clear liquids all day Wednesday 01-09-2015, no clear liquids after midnight Wednesday night.    Follow all bowel prep instructions from Dr. Alinda Money office.   Take these medicines the morning of surgery with A SIP OF WATER:  Com[place]an Eye drop                               You may not have any metal on your body including hair pins and              piercings  Do not wear jewelry, make-up, lotions, powders or perfumes, deodorant             Do not wear nail polish.  Do not shave  48 hours prior to surgery.              Men may shave face and neck.   Do not bring valuables to the hospital. [place].  Contacts, dentures or bridgework may not be worn into surgery.  Leave suitcase in the car. After surgery it may be brought to your room.     Patients discharged the day of surgery will not be allowed to drive home.  Name and phone number of your driver:  Special Instructions: N/A              Please read over the following fact sheets you were given: _____________________________________________________________________             [facility] - Preparing for Surgery Before surgery, you can play an important role.  Because skin is not sterile, your skin needs to be as free of germs as possible.  You can reduce the number of germs on your skin by washing with CHG (chlorahexidine gluconate) soap before surgery.  CHG is an antiseptic cleaner which kills germs and bonds with the skin to continue killing  germs even after washing. Please DO NOT use if you have an allergy to CHG or antibacterial soaps.  If your skin becomes reddened/irritated stop using the CHG and inform your nurse when you arrive at Short Stay. Do not shave (including legs and underarms) for at least 48 hours prior to the first CHG shower.  You may shave your face/neck. Please follow these instructions carefully:  1.  Shower with CHG Soap the night before surgery and the  morning of Surgery.  2.  If you choose to wash your hair, wash your hair first as usual with your  normal  shampoo.  3.  After you shampoo, rinse your hair and body thoroughly to remove the  shampoo.  4.  Use CHG as you would any other liquid soap.  You can apply chg directly  to the skin and wash                       Gently with a scrungie or clean washcloth.  5.  Apply the CHG Soap to your body ONLY FROM THE NECK DOWN.   Do not use on face/ open                           Wound or open sores. Avoid contact with eyes, ears mouth and genitals (private parts).                       Wash face,  Genitals (private parts) with your normal soap.             6.  Wash thoroughly, paying special attention to the area where your surgery  will be performed.  7.  Thoroughly rinse your body with warm water from the neck down.  8.  DO NOT shower/wash with your normal soap after using and rinsing off  the CHG Soap.                9.  Pat yourself dry with a clean towel.            10.  Wear clean pajamas.            11.  Place clean sheets on your bed the night of your first shower and do not  sleep with pets. Day of Surgery : Do not apply any lotions/deodorants the morning of surgery.  Please wear clean clothes to the hospital/surgery center.  FAILURE TO FOLLOW THESE INSTRUCTIONS MAY RESULT IN THE CANCELLATION OF YOUR SURGERY PATIENT SIGNATURE_________________________________  NURSE  SIGNATURE__________________________________  ________________________________________________________________________    CLEAR LIQUID DIET   Foods Allowed                                                                     Foods Excluded  Coffee and tea, regular and decaf                             liquids that you cannot  Plain Jell-O in any flavor                                             see through such as: Fruit ices (not with fruit pulp)                                     milk, soups, orange juice  Iced Popsicles                                    All solid food Carbonated beverages, regular and diet  Cranberry, grape and apple juices Sports drinks like Gatorade Lightly seasoned clear broth or consume(fat free) Sugar, honey syrup  Sample Menu Breakfast                                Lunch                                     Supper Cranberry juice                    Beef broth                            Chicken broth Jell-O                                     Grape juice                           Apple juice Coffee or tea                        Jell-O                                      Popsicle                                                Coffee or tea                        Coffee or tea  _____________________________________________________________________   WHAT IS A BLOOD TRANSFUSION? Blood Transfusion Information  A transfusion is the replacement of blood or some of its parts. Blood is made up of multiple cells which provide different functions.  Red blood cells carry oxygen and are used for blood loss replacement.  White blood cells fight against infection.  Platelets control bleeding.  Plasma helps clot blood.  Other blood products are available for specialized needs, such as hemophilia or other clotting disorders. BEFORE THE TRANSFUSION  Who gives blood for transfusions?   Healthy volunteers who are fully evaluated  to make sure their blood is safe. This is blood bank blood. Transfusion therapy is the safest it has ever been in the practice of medicine. Before blood is taken from a donor, a complete history is taken to make sure that person has no history of diseases nor engages in risky social behavior (examples are intravenous drug use or sexual activity with multiple partners). The donor's travel history is screened to minimize risk of transmitting infections, such as malaria. The donated blood is tested for signs of infectious diseases, such as HIV and hepatitis. The blood is then tested to be sure it is compatible with you in order to minimize the chance of a transfusion reaction. If you or a relative donates blood, this is often done in anticipation of surgery and is not appropriate for emergency situations. It takes many days to process the donated blood.  RISKS AND COMPLICATIONS Although transfusion therapy is very safe and saves many lives, the main dangers of transfusion include:  1. Getting an infectious disease. 2. Developing a transfusion reaction. This is an allergic reaction to something in the blood you were given. Every precaution is taken to prevent this. The decision to have a blood transfusion has been considered carefully by your caregiver before blood is given. Blood is not given unless the benefits outweigh the risks. AFTER THE TRANSFUSION  Right after receiving a blood transfusion, you will usually feel much better and more energetic. This is especially true if your red blood cells have gotten low (anemic). The transfusion raises the level of the red blood cells which carry oxygen, and this usually causes an energy increase.  The nurse administering the transfusion will monitor you carefully for complications. HOME CARE INSTRUCTIONS  No special instructions are needed after a transfusion. You may find your energy is better. Speak with your caregiver about any limitations on activity for  underlying diseases you may have. SEEK MEDICAL CARE IF:   Your condition is not improving after your transfusion.  You develop redness or irritation at the intravenous (IV) site. SEEK IMMEDIATE MEDICAL CARE IF:  Any of the following symptoms occur over the next 12 hours:  Shaking chills.  You have a temperature by mouth above 102 F (38.9 C), not controlled by medicine.  Chest, back, or muscle pain.  People around you feel you are not acting correctly or are confused.  Shortness of breath or difficulty breathing.  Dizziness and fainting.  You get a rash or develop hives.  You have a decrease in urine output.  Your urine turns a dark color or changes to pink, red, or brown. Any of the following symptoms occur over the next 10 days:  You have a temperature by mouth above 102 F (38.9 C), not controlled by medicine.  Shortness of breath.  Weakness after normal activity.  The white part of the eye turns yellow (jaundice).  You have a decrease in the amount of urine or are urinating less often.  Your urine turns a dark color or changes to pink, red, or brown. Document Released: 08/24/2000 Document Revised: 11/19/2011 Document Reviewed: 04/12/2008 ExitCare Patient Information 2014 [place].  _______________________________________________________________________  Incentive Spirometer  An incentive spirometer is a tool that can help keep your lungs clear and active. This tool measures how well you are filling your lungs with each breath. Taking long deep breaths may help reverse or decrease the chance of developing breathing (pulmonary) problems (especially infection) following:  A long period of time when you are unable to move or be active. BEFORE THE PROCEDURE   If the spirometer includes an indicator to show your best effort, your nurse or respiratory therapist will set it to a desired goal.  If possible, sit up straight or lean slightly forward. Try not to  slouch.  Hold the incentive spirometer in an upright position. INSTRUCTIONS FOR USE  3. Sit on the edge of your bed if possible, or sit up as far as you can in bed or on a chair. 4. Hold the incentive spirometer in an upright position. 5. Breathe out normally. 6. Place the mouthpiece in your mouth and seal your lips tightly around it. 7. Breathe in slowly and as deeply as possible, raising the piston or the ball toward the top of the column. 8. Hold your breath for 3-5 seconds or for as long as possible. Allow the piston  or ball to fall to the bottom of the column. 9. Remove the mouthpiece from your mouth and breathe out normally. 10. Rest for a few seconds and repeat Steps 1 through 7 at least 10 times every 1-2 hours when you are awake. Take your time and take a few normal breaths between deep breaths. 11. The spirometer may include an indicator to show your best effort. Use the indicator as a goal to work toward during each repetition. 12. After each set of 10 deep breaths, practice coughing to be sure your lungs are clear. If you have an incision (the cut made at the time of surgery), support your incision when coughing by placing a pillow or rolled up towels firmly against it. Once you are able to get out of bed, walk around indoors and cough well. You may stop using the incentive spirometer when instructed by your caregiver.  RISKS AND COMPLICATIONS  Take your time so you do not get dizzy or light-headed.  If you are in pain, you may need to take or ask for pain medication before doing incentive spirometry. It is harder to take a deep breath if you are having pain. AFTER USE  Rest and breathe slowly and easily.  It can be helpful to keep track of a log of your progress. Your caregiver can provide you with a simple table to help with this. If you are using the spirometer at home, follow these instructions: [place] IF:   You are having difficultly using the  spirometer.  You have trouble using the spirometer as often as instructed.  Your pain medication is not giving enough relief while using the spirometer.  You develop fever of 100.5 F (38.1 C) or higher. SEEK IMMEDIATE MEDICAL CARE IF:   You cough up bloody sputum that had not been present before.  You develop fever of 102 F (38.9 C) or greater.  You develop worsening pain at or near the incision site. MAKE SURE YOU:   Understand these instructions.  Will watch your condition.  Will get help right away if you are not doing well or get worse. Document Released: 01/07/2007 Document Revised: 11/19/2011 Document Reviewed: 03/10/2007 [facility] Patient Information 2014 [place], [place].   ________________________________________________________________________

## 2015-02-09 ENCOUNTER — Encounter (HOSPITAL_COMMUNITY): Payer: Self-pay | Admitting: Anesthesiology

## 2015-02-09 NOTE — H&P (Signed)
Chief Complaint  Prostate Cancer   Reason For Visit  Reason for consult: To discuss treatment options for prostate cancer and specifically to consider robotic surgery. Physician requesting consult: Dr. Eda Keys PCP: Dr. Burnadette Pop Location of consult: [facility]   History of Present Illness       Paul Kennedy is a 69 year old with a history of hypertension, sleep apnea, and glaucoma who was found to have an elevated PSA of 5.8 prompting a prostate needle biopsy by Dr. Junious Silk on 11/12/14.  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 He has undergone staging studies including a CT scan of the abdomen and pelvis on 12/01/13 that was negative for metastatic disease and a bone scan on 12/03/14 that showed uptake of the radiotracer in multiple ribs, thoracic spine, lumbar spine, and bilateral inferior pubic rami.  Plain films of the thoracic and lumbar spine suggested degenerative changes and plain films of the pelvis that were not concerning for metastatic disease.  He recently had Paul PSA rechecked and was 9.05 although was checked only about a month after Paul biopsy.  He does have a family history of prostate cancer with both Paul maternal grandfather and uncle having had prostate cancer.  Of note, he also informs me that although he did not put this on Paul history form, he has been using an over-the-counter form of oral testosterone and transdermal testosterone since 2015.  This has provided quality of life benefit with regard to Paul energy level and libido.  He has not undergone laboratory testing and has not been taking this under the direction of the healthcare provider.  TNM stage: cT1c N0 M0 PSA: 5.8 (9.05 when rechecked after Paul biopsy) Gleason score: 4+4=8 Biopsy (11/12/14): 4/12 cores positive    Left: L lateral mid (5%, 3+3=6), L mid (20%, 3+4=7), L base (70%,  4+3=7)    Right: R base (5%, 4+4=8) Prostate volume: 25.6 cc  Nomogram OC disease: 26% EPE: 70% SVI: 15% LNI:16% PFS (surgery): 50% at 5 years, 35% at 10 years  Urinary function: He denies significant lower urinary tract symptoms.  IPSS is 1. 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  SHIM score is 17.  He can reliably obtain an erection adequate for intercourse with Viagra 100 mg.  However, he does have difficulty obtaining an adequate erection without Viagra.   Past Medical History  1. History of Arthritis  2. History of glaucoma (Z86.69)  3. History of hypertension (Z86.79)  4. History of sleep apnea (Z87.09)   He does use CPAP.   Surgical History  1. History of Colonoscopy (Fiberoptic)  2. History of Orchiectomy Right  3. History of Tonsillectomy  Current Meds  1. Aspirin 81 MG TABS;  Therapy: (Recorded:29Sep2009) to Recorded  2. Hydrochlorothiazide 25 MG Oral Tablet;  Therapy: (Recorded:29Sep2009) to Recorded  3. Klor-Con M20 20 MEQ Oral Tablet Extended Release;  Therapy: [id number] to Recorded  4. Travatan Z 0.004 % Ophthalmic Solution;  Therapy: [id number] to Recorded  Allergies  1. No Known Drug Allergies  Family History  1. Family history of Death In The Family Father : Father  2. Family history of Diabetes Mellitus : Mother  3. Family history of Diabetes Mellitus : Sister  4. Family history of Family Health Status - Mother's Age : Mother  5. Family history of Family Health Status Number Of Children  6. Family  history of Hypertension : Mother  Social History   Denied: History of Alcohol Use   Former smoker [phone])   Marital History - Currently Married   Occupation:  Review of Systems AU Complete-Male: Genitourinary, constitutional, skin, eye, otolaryngeal, hematologic/lymphatic, cardiovascular, pulmonary, endocrine, musculoskeletal, gastrointestinal, neurological and psychiatric system(s) were reviewed and pertinent findings if  present are noted and are otherwise negative.  Constitutional: no night sweats and no recent weight loss.  Cardiovascular: no chest pain.  Respiratory: no shortness of breath.    Physical Exam Constitutional: Well nourished and well developed . No acute distress.  ENT:. The ears and nose are normal in appearance.  Neck: The appearance of the neck is normal and no neck mass is present.  Pulmonary: No respiratory distress, normal respiratory rhythm and effort and clear bilateral breath sounds.  Cardiovascular: Heart rate and rhythm are normal . No peripheral edema.  Abdomen: The abdomen is soft and nontender. No masses are palpated. No CVA tenderness. No hernias are palpable. No hepatosplenomegaly noted.  Rectal: Rectal exam demonstrates normal sphincter tone, no tenderness and no masses. Prostate size is estimated to be 35 g. The prostate has no nodularity and is not tender. The left seminal vesicle is nonpalpable. The right seminal vesicle is nonpalpable. The perineum is normal on inspection.  Lymphatics: The femoral and inguinal nodes are not enlarged or tender.  Skin: Normal skin turgor, no visible rash and no visible skin lesions.  Neuro/Psych:. Mood and affect are appropriate.    Results/Data  I have independently reviewed Paul medical records, PSA results, and pathology report.  We have reviewed Paul pathology slides today.  Although he has high-grade disease, he appears to have relatively low volume disease.  The areas of discontinuous disease measured at 70% actually shows only a very small volume of disease in multiple places indicating low volume disease even in this particular biopsy.  We also reviewed Paul imaging studies including bone scan, CT scan, and plain film images.  Findings are most consistent with multiple degenerative changes/trauma of the thoracic and lumbar spine, pelvis, and ribs.  He has not felt to be at high suspicion for having metastatic disease.     Assessment  1.  Prostate cancer (C61)  Discussion/Summary  1.  High risk, localized prostate cancer: I had a long and detailed discussion with Paul Kennedy and Paul Kennedy today.  I did recommend therapy of curative intent and an somewhat optimistic about Paul chance for cure considering Paul high-grade but low volume disease.  I did recommend that he consider aggressive therapy with either surgery or IMRT in combination with long-term androgen deprivation therapy.  We have reviewed the pros and cons of each approach today in detail.  He has seen Dr. Alen Blew and is scheduled to see Dr. Tammi Klippel later this morning to further discuss radiotherapy.   The patient was counseled about the natural history of prostate cancer and the standard treatment options that are available for prostate cancer. It was explained to him how Paul age and life expectancy, clinical stage, Gleason score, and PSA affect Paul prognosis, the decision to proceed with additional staging studies, as well as how that information influences recommended treatment strategies. We discussed the roles for active surveillance, radiation therapy, surgical therapy, androgen deprivation, as well as ablative therapy options for the treatment of prostate cancer as appropriate to Paul individual cancer situation. We discussed the risks and benefits of these options with regard to their impact on cancer control and also in  terms of potential adverse events, complications, and impact on quiality of life particularly related to urinary, bowel, and sexual function. The patient was encouraged to ask questions throughout the discussion today and all questions were answered to Paul stated satisfaction. In addition, the patient was provided with and/or directed to appropriate resources and literature for further education about prostate cancer and treatment options.   We discussed surgical therapy for prostate cancer including the different available surgical approaches. We discussed, in  detail, the risks and expectations of surgery with regard to cancer control, urinary control, and erectile function as well as the expected postoperative recovery process. Additional risks of surgery including but not limited to bleeding, infection, hernia formation, nerve damage, lymphocele formation, bowel/rectal injury potentially necessitating colostomy, damage to the urinary tract resulting in urine leakage, urethral stricture, and the cardiopulmonary risks such as myocardial infarction, stroke, death, venothromboembolism, etc. were explained. The risk of open surgical conversion for robotic/laparoscopic prostatectomy was also discussed.   We also discussed Paul testosterone replacement therapy that he has been taking with over-the-counter supplements.  I did recommend that he stop the supplements currently.  We discussed the potential risks and benefits of testosterone replacement therapy after definitive treatment of prostate cancer.  He understands that this is something we can potentially consider depending on the effects related to Paul quality of life.  He understands that it may be more uncertain as far as the safety of testosterone replacement therapy in the setting of past radiation therapy especially when we're utilizing androgen deprivation as a component of therapy.  He will consider this with Paul decision.  We did discuss sexual function in detail today.  He understands that there would be a very high risk of developing erectile dysfunction and would be refractory to oral medical therapy based on Paul current erectile function.  He has a very good understanding of the situation and Paul treatment options.  He will consider Paul options and let us know how he would like to proceed.  If he proceeds with surgical treatment, I would plan to proceed with a bilateral nerve sparing robot-assisted left scopic radical prostatectomy and bilateral pelvic lymphadenectomy.  Cc: Dr. Burnadette Pop Dr. Tyler Pita Dr. Zola Button Dr. Festus Aloe  A total of 70 minutes were spent in the overall care of the patient today with 55 minutes in direct face to face consultation.    Signatures Electronically signed by : Raynelle Bring, M.D.; Jan 21 2015 12:41PM EST Electronically signed by : Raynelle Bring, M.D.; Jan 21 2015 12:42PM EST

## 2015-02-09 NOTE — Progress Notes (Signed)
EKG reviewed by Dr. Tobias Alexander - OK for surgery

## 2015-02-09 NOTE — Anesthesia Preprocedure Evaluation (Addendum)
Anesthesia Evaluation  Patient identified by MRN, date of birth, ID band Patient awake    Reviewed: Allergy & Precautions, NPO status , Patient's Chart, lab work & pertinent test results  History of Anesthesia Complications (+) PONV and history of anesthetic complications  Airway Mallampati: II  TM Distance: >3 FB Neck ROM: Full    Dental no notable dental hx.    Pulmonary sleep apnea , former smoker,  breath sounds clear to auscultation  Pulmonary exam normal       Cardiovascular Exercise Tolerance: Good hypertension, Pt. on medications Normal cardiovascular examRhythm:Regular Rate:Normal     Neuro/Psych negative neurological ROS  negative psych ROS   GI/Hepatic negative GI ROS, Neg liver ROS,   Endo/Other  negative endocrine ROS  Renal/GU negative Renal ROS  negative genitourinary   Musculoskeletal  (+) Arthritis -,   Abdominal (+) + obese,   Peds negative pediatric ROS (+)  Hematology negative hematology ROS (+)   Anesthesia Other Findings   Reproductive/Obstetrics negative OB ROS                           Anesthesia Physical Anesthesia Plan  ASA: II  Anesthesia Plan: General   Post-op Pain Management:    Induction: Intravenous  Airway Management Planned: Oral ETT  Additional Equipment:   Intra-op Plan:   Post-operative Plan: Extubation in OR  Informed Consent: I have reviewed the patients History and Physical, chart, labs and discussed the procedure including the risks, benefits and alternatives for the proposed anesthesia with the patient or authorized representative who has indicated his/her understanding and acceptance.   Dental advisory given  Plan Discussed with: CRNA  Anesthesia Plan Comments:         Anesthesia Quick Evaluation

## 2015-02-10 ENCOUNTER — Inpatient Hospital Stay (HOSPITAL_COMMUNITY)
Admission: RE | Admit: 2015-02-10 | Discharge: 2015-02-11 | DRG: 708 | Disposition: A | Discharge status: Home / Self Care | Payer: Medicare Other | Source: Ambulatory Visit | Attending: Urology | Admitting: Urology

## 2015-02-10 ENCOUNTER — Encounter (HOSPITAL_COMMUNITY): Payer: Self-pay | Admitting: *Deleted

## 2015-02-10 ENCOUNTER — Encounter (HOSPITAL_COMMUNITY)
Admission: RE | Disposition: A | Discharge status: Home / Self Care | Payer: Self-pay | Source: Ambulatory Visit | Attending: Urology

## 2015-02-10 ENCOUNTER — Inpatient Hospital Stay (HOSPITAL_COMMUNITY): Payer: Medicare Other | Admitting: Anesthesiology

## 2015-02-10 DIAGNOSIS — G4730 Sleep apnea, unspecified: Secondary | ICD-10-CM | POA: Diagnosis present

## 2015-02-10 DIAGNOSIS — I10 Essential (primary) hypertension: Secondary | ICD-10-CM | POA: Diagnosis present

## 2015-02-10 DIAGNOSIS — Z87891 Personal history of nicotine dependence: Secondary | ICD-10-CM | POA: Diagnosis not present

## 2015-02-10 DIAGNOSIS — Z01812 Encounter for preprocedural laboratory examination: Secondary | ICD-10-CM | POA: Diagnosis not present

## 2015-02-10 DIAGNOSIS — Z8042 Family history of malignant neoplasm of prostate: Secondary | ICD-10-CM

## 2015-02-10 DIAGNOSIS — C61 Malignant neoplasm of prostate: Principal | ICD-10-CM | POA: Diagnosis present

## 2015-02-10 DIAGNOSIS — H409 Unspecified glaucoma: Secondary | ICD-10-CM | POA: Diagnosis present

## 2015-02-10 DIAGNOSIS — Z833 Family history of diabetes mellitus: Secondary | ICD-10-CM | POA: Diagnosis not present

## 2015-02-10 DIAGNOSIS — Z8249 Family history of ischemic heart disease and other diseases of the circulatory system: Secondary | ICD-10-CM | POA: Diagnosis not present

## 2015-02-10 DIAGNOSIS — N529 Male erectile dysfunction, unspecified: Secondary | ICD-10-CM | POA: Diagnosis present

## 2015-02-10 HISTORY — PX: ROBOT ASSISTED LAPAROSCOPIC RADICAL PROSTATECTOMY: SHX5141

## 2015-02-10 HISTORY — PX: LYMPHADENECTOMY: SHX5960

## 2015-02-10 LAB — TYPE AND SCREEN
ABO/RH(D): O POS
Antibody Screen: NEGATIVE

## 2015-02-10 LAB — HEMOGLOBIN AND HEMATOCRIT, BLOOD
HCT: 35.7 % — ABNORMAL LOW (ref 39.0–52.0)
Hemoglobin: 12.2 g/dL — ABNORMAL LOW (ref 13.0–17.0)

## 2015-02-10 SURGERY — ROBOTIC ASSISTED LAPAROSCOPIC RADICAL PROSTATECTOMY LEVEL 2
Anesthesia: General

## 2015-02-10 MED ORDER — HYDROMORPHONE HCL 1 MG/ML IJ SOLN: 0.2500 mg | INTRAMUSCULAR | Status: DC | PRN

## 2015-02-10 MED ORDER — FENTANYL CITRATE (PF) 100 MCG/2ML IJ SOLN
INTRAMUSCULAR | Status: AC
  Filled 2015-02-10: qty 2

## 2015-02-10 MED ORDER — SODIUM CHLORIDE 0.9 % IR SOLN
Status: DC | PRN
  Administered 2015-02-10: 1000 mL via INTRAVESICAL

## 2015-02-10 MED ORDER — PROPOFOL 10 MG/ML IV BOLUS
INTRAVENOUS | Status: AC
  Filled 2015-02-10: qty 20

## 2015-02-10 MED ORDER — DIPHENHYDRAMINE HCL 50 MG/ML IJ SOLN: 12.5000 mg | Freq: Four times a day (QID) | INTRAMUSCULAR | Status: DC | PRN

## 2015-02-10 MED ORDER — HYDROMORPHONE HCL 1 MG/ML IJ SOLN
INTRAMUSCULAR | Status: DC | PRN
  Administered 2015-02-10 (×2): 1 mg via INTRAVENOUS

## 2015-02-10 MED ORDER — ROCURONIUM BROMIDE 100 MG/10ML IV SOLN
INTRAVENOUS | Status: DC | PRN
  Administered 2015-02-10: 10 mg via INTRAVENOUS
  Administered 2015-02-10: 50 mg via INTRAVENOUS
  Administered 2015-02-10 (×2): 5 mg via INTRAVENOUS

## 2015-02-10 MED ORDER — GLYCOPYRROLATE 0.2 MG/ML IJ SOLN
INTRAMUSCULAR | Status: DC | PRN
  Administered 2015-02-10: 0.6 mg via INTRAVENOUS

## 2015-02-10 MED ORDER — KCL IN DEXTROSE-NACL 20-5-0.45 MEQ/L-%-% IV SOLN
INTRAVENOUS | Status: AC
  Administered 2015-02-10: 14:00:00
  Filled 2015-02-10: qty 1000

## 2015-02-10 MED ORDER — DIPHENHYDRAMINE HCL 12.5 MG/5ML PO ELIX
12.5000 mg | ORAL_SOLUTION | Freq: Four times a day (QID) | ORAL | Status: DC | PRN
Start: 2015-02-10 — End: 2015-02-11

## 2015-02-10 MED ORDER — CEFAZOLIN SODIUM-DEXTROSE 2-3 GM-% IV SOLR
2.0000 g | INTRAVENOUS | Status: AC
  Administered 2015-02-10: 2 g via INTRAVENOUS

## 2015-02-10 MED ORDER — BRIMONIDINE TARTRATE-TIMOLOL 0.2-0.5 % OP SOLN: 1.0000 [drp] | Freq: Two times a day (BID) | OPHTHALMIC | Status: DC

## 2015-02-10 MED ORDER — HYDROCHLOROTHIAZIDE 25 MG PO TABS
25.0000 mg | ORAL_TABLET | Freq: Every day | ORAL | Status: DC
  Administered 2015-02-10 – 2015-02-11 (×2): 25 mg via ORAL
  Filled 2015-02-10 (×2): qty 1

## 2015-02-10 MED ORDER — MIDAZOLAM HCL 5 MG/5ML IJ SOLN
INTRAMUSCULAR | Status: DC | PRN
  Administered 2015-02-10: 2 mg via INTRAVENOUS

## 2015-02-10 MED ORDER — FENTANYL CITRATE (PF) 250 MCG/5ML IJ SOLN
INTRAMUSCULAR | Status: AC
  Filled 2015-02-10: qty 5

## 2015-02-10 MED ORDER — DEXAMETHASONE SODIUM PHOSPHATE 10 MG/ML IJ SOLN
INTRAMUSCULAR | Status: DC | PRN
  Administered 2015-02-10: 10 mg via INTRAVENOUS

## 2015-02-10 MED ORDER — LIDOCAINE HCL (CARDIAC) 20 MG/ML IV SOLN
INTRAVENOUS | Status: DC | PRN
  Administered 2015-02-10: 100 mg via INTRAVENOUS

## 2015-02-10 MED ORDER — LACTATED RINGERS IV SOLN
INTRAVENOUS | Status: DC | PRN
  Administered 2015-02-10: 1 mL

## 2015-02-10 MED ORDER — HYDROMORPHONE HCL 2 MG/ML IJ SOLN
INTRAMUSCULAR | Status: AC
  Filled 2015-02-10: qty 1

## 2015-02-10 MED ORDER — ROCURONIUM BROMIDE 100 MG/10ML IV SOLN
INTRAVENOUS | Status: AC
  Filled 2015-02-10: qty 1

## 2015-02-10 MED ORDER — CEFAZOLIN SODIUM-DEXTROSE 2-3 GM-% IV SOLR
INTRAVENOUS | Status: AC
  Filled 2015-02-10: qty 50

## 2015-02-10 MED ORDER — LACTATED RINGERS IV SOLN
INTRAVENOUS | Status: DC | PRN
  Administered 2015-02-10 (×2): via INTRAVENOUS

## 2015-02-10 MED ORDER — BUPIVACAINE-EPINEPHRINE (PF) 0.25% -1:200000 IJ SOLN
INTRAMUSCULAR | Status: AC
  Filled 2015-02-10: qty 30

## 2015-02-10 MED ORDER — ONDANSETRON HCL 4 MG/2ML IJ SOLN
INTRAMUSCULAR | Status: AC
  Filled 2015-02-10: qty 2

## 2015-02-10 MED ORDER — SUCCINYLCHOLINE CHLORIDE 20 MG/ML IJ SOLN
INTRAMUSCULAR | Status: DC | PRN
Start: 2015-02-10 — End: 2015-02-10
  Administered 2015-02-10: 100 mg via INTRAVENOUS

## 2015-02-10 MED ORDER — PROPOFOL 10 MG/ML IV BOLUS
INTRAVENOUS | Status: DC | PRN
  Administered 2015-02-10: 200 mg via INTRAVENOUS

## 2015-02-10 MED ORDER — DOCUSATE SODIUM 100 MG PO CAPS
100.0000 mg | ORAL_CAPSULE | Freq: Two times a day (BID) | ORAL | Status: DC
  Administered 2015-02-10 – 2015-02-11 (×2): 100 mg via ORAL
  Filled 2015-02-10 (×2): qty 1

## 2015-02-10 MED ORDER — HEPARIN SODIUM (PORCINE) 1000 UNIT/ML IJ SOLN
INTRAMUSCULAR | Status: AC
  Filled 2015-02-10: qty 1

## 2015-02-10 MED ORDER — SULFAMETHOXAZOLE-TRIMETHOPRIM 800-160 MG PO TABS: 1.0000 | ORAL_TABLET | Freq: Two times a day (BID) | ORAL | Status: DC

## 2015-02-10 MED ORDER — TIMOLOL MALEATE 0.5 % OP SOLN
1.0000 [drp] | Freq: Two times a day (BID) | OPHTHALMIC | Status: DC
  Administered 2015-02-11: 1 [drp] via OPHTHALMIC
  Filled 2015-02-10: qty 5

## 2015-02-10 MED ORDER — BUPIVACAINE-EPINEPHRINE 0.25% -1:200000 IJ SOLN
INTRAMUSCULAR | Status: DC | PRN
  Administered 2015-02-10: 27 mL

## 2015-02-10 MED ORDER — STERILE WATER FOR IRRIGATION IR SOLN
Status: DC | PRN
  Administered 2015-02-10: 3000 mL

## 2015-02-10 MED ORDER — CEFAZOLIN SODIUM 1-5 GM-% IV SOLN
1.0000 g | Freq: Three times a day (TID) | INTRAVENOUS | Status: AC
  Administered 2015-02-10 (×2): 1 g via INTRAVENOUS
  Filled 2015-02-10 (×2): qty 50

## 2015-02-10 MED ORDER — BRIMONIDINE TARTRATE 0.2 % OP SOLN
1.0000 [drp] | Freq: Two times a day (BID) | OPHTHALMIC | Status: DC
  Administered 2015-02-11: 1 [drp] via OPHTHALMIC
  Filled 2015-02-10: qty 5

## 2015-02-10 MED ORDER — MORPHINE SULFATE 2 MG/ML IJ SOLN: 2.0000 mg | INTRAMUSCULAR | Status: DC | PRN

## 2015-02-10 MED ORDER — NEOSTIGMINE METHYLSULFATE 10 MG/10ML IV SOLN
INTRAVENOUS | Status: DC | PRN
  Administered 2015-02-10: 5 mg via INTRAVENOUS

## 2015-02-10 MED ORDER — HYDROCODONE-ACETAMINOPHEN 5-325 MG PO TABS: 1.0000 | ORAL_TABLET | Freq: Four times a day (QID) | ORAL | Status: AC | PRN

## 2015-02-10 MED ORDER — SODIUM CHLORIDE 0.9 % IV BOLUS (SEPSIS)
1000.0000 mL | Freq: Once | INTRAVENOUS | Status: AC
  Administered 2015-02-10: 1000 mL via INTRAVENOUS

## 2015-02-10 MED ORDER — FENTANYL CITRATE (PF) 100 MCG/2ML IJ SOLN
INTRAMUSCULAR | Status: DC | PRN
  Administered 2015-02-10: 25 ug via INTRAVENOUS
  Administered 2015-02-10 (×3): 50 ug via INTRAVENOUS
  Administered 2015-02-10: 100 ug via INTRAVENOUS

## 2015-02-10 MED ORDER — ACETAMINOPHEN 325 MG PO TABS
650.0000 mg | ORAL_TABLET | ORAL | Status: DC | PRN
  Administered 2015-02-11: 650 mg via ORAL
  Filled 2015-02-10: qty 2

## 2015-02-10 MED ORDER — DEXAMETHASONE SODIUM PHOSPHATE 10 MG/ML IJ SOLN
INTRAMUSCULAR | Status: AC
  Filled 2015-02-10: qty 1

## 2015-02-10 MED ORDER — KETOROLAC TROMETHAMINE 15 MG/ML IJ SOLN
15.0000 mg | Freq: Four times a day (QID) | INTRAMUSCULAR | Status: DC
  Administered 2015-02-10 – 2015-02-11 (×5): 15 mg via INTRAVENOUS
  Filled 2015-02-10 (×6): qty 1

## 2015-02-10 MED ORDER — MIDAZOLAM HCL 2 MG/2ML IJ SOLN
INTRAMUSCULAR | Status: AC
  Filled 2015-02-10: qty 2

## 2015-02-10 MED ORDER — LIDOCAINE HCL (CARDIAC) 20 MG/ML IV SOLN
INTRAVENOUS | Status: AC
  Filled 2015-02-10: qty 5

## 2015-02-10 MED ORDER — KCL IN DEXTROSE-NACL 20-5-0.45 MEQ/L-%-% IV SOLN
INTRAVENOUS | Status: DC
  Administered 2015-02-10: 1000 mL via INTRAVENOUS
  Administered 2015-02-10 – 2015-02-11 (×2): via INTRAVENOUS
  Filled 2015-02-10 (×6): qty 1000

## 2015-02-10 MED ORDER — ONDANSETRON HCL 4 MG/2ML IJ SOLN
INTRAMUSCULAR | Status: DC | PRN
  Administered 2015-02-10: 4 mg via INTRAVENOUS

## 2015-02-10 SURGICAL SUPPLY — 51 items
CABLE HIGH FREQUENCY MONO STRZ (ELECTRODE) ×3 IMPLANT
CATH FOLEY 2WAY SLVR 18FR 30CC (CATHETERS) ×3 IMPLANT
CATH ROBINSON RED A/P 16FR (CATHETERS) ×3 IMPLANT
CATH ROBINSON RED A/P 8FR (CATHETERS) ×3 IMPLANT
CATH TIEMANN FOLEY 18FR 5CC (CATHETERS) ×3 IMPLANT
CHLORAPREP W/TINT 26ML (MISCELLANEOUS) ×3 IMPLANT
CLIP LIGATING HEM O LOK PURPLE (MISCELLANEOUS) ×9 IMPLANT
CLOTH BEACON ORANGE TIMEOUT ST (SAFETY) ×3 IMPLANT
COVER SURGICAL LIGHT HANDLE (MISCELLANEOUS) ×3 IMPLANT
COVER TIP SHEARS 8 DVNC (MISCELLANEOUS) ×2 IMPLANT
COVER TIP SHEARS 8MM DA VINCI (MISCELLANEOUS) ×1
CUTTER ECHEON FLEX ENDO 45 340 (ENDOMECHANICALS) ×3 IMPLANT
DECANTER SPIKE VIAL GLASS SM (MISCELLANEOUS) ×3 IMPLANT
DRAPE SURG IRRIG POUCH 19X23 (DRAPES) ×3 IMPLANT
DRSG TEGADERM 4X4.75 (GAUZE/BANDAGES/DRESSINGS) ×3 IMPLANT
DRSG TEGADERM 6X8 (GAUZE/BANDAGES/DRESSINGS) ×6 IMPLANT
ELECT REM PT RETURN 9FT ADLT (ELECTROSURGICAL) ×3
ELECTRODE REM PT RTRN 9FT ADLT (ELECTROSURGICAL) ×2 IMPLANT
GAUZE SPONGE 2X2 8PLY STRL LF (GAUZE/BANDAGES/DRESSINGS) ×2 IMPLANT
GLOVE BIO SURGEON STRL SZ 6.5 (GLOVE) ×3 IMPLANT
GLOVE BIOGEL M STRL SZ7.5 (GLOVE) ×6 IMPLANT
GOWN STRL REUS W/TWL LRG LVL3 (GOWN DISPOSABLE) ×9 IMPLANT
HOLDER FOLEY CATH W/STRAP (MISCELLANEOUS) ×3 IMPLANT
IV LACTATED RINGERS 1000ML (IV SOLUTION) ×3 IMPLANT
KIT ACCESSORY DA VINCI DISP (KITS) ×1
KIT ACCESSORY DVNC DISP (KITS) ×2 IMPLANT
LIQUID BAND (GAUZE/BANDAGES/DRESSINGS) ×3 IMPLANT
MANIFOLD NEPTUNE II (INSTRUMENTS) ×3 IMPLANT
NDL SAFETY ECLIPSE 18X1.5 (NEEDLE) ×2 IMPLANT
NEEDLE HYPO 18GX1.5 SHARP (NEEDLE) ×1
PACK ROBOT UROLOGY CUSTOM (CUSTOM PROCEDURE TRAY) ×3 IMPLANT
RELOAD GREEN ECHELON 45 (STAPLE) ×3 IMPLANT
SET TUBE IRRIG SUCTION NO TIP (IRRIGATION / IRRIGATOR) ×3 IMPLANT
SHEET LAVH (DRAPES) IMPLANT
SOLUTION ELECTROLUBE (MISCELLANEOUS) ×3 IMPLANT
SPONGE GAUZE 2X2 STER 10/PKG (GAUZE/BANDAGES/DRESSINGS) ×1
SUT ETHILON 3 0 PS 1 (SUTURE) ×3 IMPLANT
SUT MNCRL 3 0 RB1 (SUTURE) ×2 IMPLANT
SUT MNCRL 3 0 VIOLET RB1 (SUTURE) ×2 IMPLANT
SUT MNCRL AB 4-0 PS2 18 (SUTURE) ×6 IMPLANT
SUT MONOCRYL 3 0 RB1 (SUTURE) ×2
SUT VIC AB 0 CT1 27 (SUTURE) ×1
SUT VIC AB 0 CT1 27XBRD ANTBC (SUTURE) ×2 IMPLANT
SUT VIC AB 0 UR5 27 (SUTURE) ×3 IMPLANT
SUT VIC AB 2-0 SH 27 (SUTURE) ×1
SUT VIC AB 2-0 SH 27X BRD (SUTURE) ×2 IMPLANT
SUT VICRYL 0 UR6 27IN ABS (SUTURE) ×6 IMPLANT
SYR 27GX1/2 1ML LL SAFETY (SYRINGE) ×3 IMPLANT
TOWEL OR 17X26 10 PK STRL BLUE (TOWEL DISPOSABLE) ×3 IMPLANT
TOWEL OR NON WOVEN STRL DISP B (DISPOSABLE) ×3 IMPLANT
WATER STERILE IRR 1500ML POUR (IV SOLUTION) ×6 IMPLANT

## 2015-02-10 NOTE — Discharge Instructions (Signed)
1. Activity:  You are encouraged to ambulate frequently (about every hour during waking hours) to help prevent blood clots from forming in your legs or lungs.  However, you should not engage in any heavy lifting (> 10-15 lbs), strenuous activity, or straining. 2. Diet: You should continue a clear liquid diet until passing gas from below.  Once this occurs, you may advance your diet to a soft diet that would be easy to digest (i.e soups, scrambled eggs, mashed potatoes, etc.) for 24 hours just as you would if getting over a bad stomach flu.  If tolerating this diet well for 24 hours, you may then begin eating regular food.  It will be normal to have some amount of bloating, nausea, and abdominal discomfort intermittently. 3. Prescriptions:  You will be provided a prescription for pain medication to take as needed.  If your pain is not severe enough to require the prescription pain medication, you may take Tylenol instead.  You should also take an over the counter stool softener (Colace 100 mg twice daily) to avoid straining with bowel movements as the pain medication may constipate you. Finally, you will also be provided a prescription for an antibiotic to begin the day prior to your return visit in the office for catheter removal. 4. Catheter care: You will be taught how to take care of the catheter by the nursing staff prior to discharge from the hospital.  You may use both a leg bag and the larger bedside bag but it is recommended to at least use the bigger bedside bag at nighttime as the leg bag is small and will fill up overnight and also does not drain as well when lying flat. You may periodically feel a strong urge to void with the catheter in place.  This is a bladder spasm and most often can occur when having a bowel movement or when you are moving around. It is typically self-limited and usually will stop after a few minutes.  You may use some Vaseline or Neosporin around the tip of the catheter to  reduce friction at the tip of the penis. 5. Incisions: You may remove your dressing bandages the 2nd day after surgery.  You most likely will have a few small staples in each of the incisions and once the bandages are removed, the incisions may stay open to air.  You may start showering (not soaking or bathing in water) 48 hours after surgery and the incisions simply need to be patted dry after the shower.  No additional care is needed. 6. What to call us about: You should call the office [phone] if you develop fever > 101, persistent vomiting, or the catheter stops draining. Also, feel free to call with any other questions you may have and remember the handout that was provided to you as a reference preoperatively which answers many of the common questions that arise after surgery. 7. You may resume aspirin, advil, aleve, vitamins, and supplements 7 days after surgery.  Do not restart viagra until discussed with Dr. Alinda Money

## 2015-02-10 NOTE — Progress Notes (Signed)
Rt called to set up CPAP in PACU. CPAP on auto with 5lpm bleed in. Vitals normal pt in no distress at this time.

## 2015-02-10 NOTE — Interval H&P Note (Signed)
History and Physical Interval Note:  02/10/2015 7:09 AM  Paul Kennedy  has presented today for surgery, with the diagnosis of PROSTATE CANCER  The various methods of treatment have been discussed with the patient and family. After consideration of risks, benefits and other options for treatment, the patient has consented to  Procedure(s): ROBOTIC ASSISTED LAPAROSCOPIC RADICAL PROSTATECTOMY LEVEL 2 (N/A) PELVIC LYMPHADENECTOMY (Bilateral) as a surgical intervention .  The patient's history has been reviewed, patient examined, no change in status, stable for surgery.  I have reviewed the patient's chart and labs.  Questions were answered to the patient's satisfaction.     Edith Groleau,LES

## 2015-02-10 NOTE — Progress Notes (Signed)
Pt has refused CPAP for the night, Pt stated that he would call if he decided he needed it.  RT to monitor and assess as needed.

## 2015-02-10 NOTE — Anesthesia Postprocedure Evaluation (Signed)
  Anesthesia Post-op Note  Patient: Paul Kennedy  Procedure(s) Performed: Procedure(s) (LRB): ROBOTIC ASSISTED LAPAROSCOPIC RADICAL PROSTATECTOMY LEVEL 2 (N/A) PELVIC LYMPHADENECTOMY (Bilateral)  Patient Location: PACU  Anesthesia Type: General  Level of Consciousness: awake and alert   Airway and Oxygen Therapy: Patient Spontanous Breathing  Post-op Pain: mild  Post-op Assessment: Post-op Vital signs reviewed, Patient's Cardiovascular Status Stable, Respiratory Function Stable, Patent Airway and No signs of Nausea or vomiting  Last Vitals:  Filed Vitals:   02/10/15 1309  BP: 146/87  Pulse: 74  Temp: 36.4 C  Resp: 14    Post-op Vital Signs: stable   Complications: No apparent anesthesia complications

## 2015-02-10 NOTE — Op Note (Addendum)
Preoperative diagnosis: Clinically localized adenocarcinoma of the prostate (clinical stage T1c N0 M0)  Postoperative diagnosis: Clinically localized adenocarcinoma of the prostate (clinical stage T1c N0 M0)  Procedure:  1. Robotic assisted laparoscopic radical prostatectomy (bilateral nerve sparing) 2. Bilateral robotic assisted laparoscopic pelvic lymphadenectomy  Surgeon: Pryor Curia. M.D.  Assistant: Debbrah Alar, PA-C  Resident: Dr. Curt Bears  Anesthesia: General  Complications: None  EBL: 500 mL  IVF:  1200 mL crystalloid  Specimens: 1. Prostate and seminal vesicles 2. Right pelvic lymph nodes 3. Left pelvic lymph nodes  Disposition of specimens: Pathology  Intraoperative findings: Right accessory obturator artery - preserved  Drains: 1. 20 Fr coude catheter 2. # 19 Blake pelvic drain  Indication: Paul Kennedy is a 69 y.o. year old patient with clinically localized prostate cancer.  After a thorough review of the management options for treatment of prostate cancer, he elected to proceed with surgical therapy and the above procedure(s).  We have discussed the potential benefits and risks of the procedure, side effects of the proposed treatment, the likelihood of the patient achieving the goals of the procedure, and any potential problems that might occur during the procedure or recuperation. Informed consent has been obtained.  Description of procedure:  The patient was taken to the operating room and a general anesthetic was administered. He was given preoperative antibiotics, placed in the dorsal lithotomy position, and prepped and draped in the usual sterile fashion. Next a preoperative timeout was performed. A urethral catheter was placed into the bladder and a site was selected near the umbilicus for placement of the camera port. This was placed using a standard open Hassan technique which allowed entry into the peritoneal cavity under direct vision and  without difficulty. A 12 mm port was placed and a pneumoperitoneum established. The camera was then used to inspect the abdomen and there was no evidence of any intra-abdominal injuries or other abnormalities. The remaining abdominal ports were then placed. 8 mm robotic ports were placed in the right lower quadrant, left lower quadrant, and far left lateral abdominal wall. A 5 mm port was placed in the right upper quadrant and a 12 mm port was placed in the right lateral abdominal wall for laparoscopic assistance. All ports were placed under direct vision without difficulty. The surgical cart was then docked.   Utilizing the cautery scissors, the bladder was reflected posteriorly allowing entry into the space of Retzius and identification of the endopelvic fascia and prostate. The periprostatic fat was then removed from the prostate allowing full exposure of the endopelvic fascia. The endopelvic fascia was then incised from the apex back to the base of the prostate bilaterally and the underlying levator muscle fibers were swept laterally off the prostate thereby isolating the dorsal venous complex. There was noted to be a right accessory obturator artery that was preserved. The dorsal vein was then stapled and divided with a 45 mm Flex Echelon stapler. Attention then turned to the bladder neck which was divided anteriorly thereby allowing entry into the bladder and exposure of the urethral catheter. The catheter balloon was deflated and the catheter was brought into the operative field and used to retract the prostate anteriorly. The posterior bladder neck was then examined and was divided allowing further dissection between the bladder and prostate posteriorly until the vasa deferentia and seminal vessels were identified. The vasa deferentia were isolated, divided, and lifted anteriorly. The seminal vesicles were dissected down to their tips with care to control the  seminal vascular arterial blood supply. These  structures were then lifted anteriorly and the space between Denonvillier's fascia and the anterior rectum was developed with a combination of sharp and blunt dissection. This isolated the vascular pedicles of the prostate.  The lateral prostatic fascia was then sharply incised allowing release of the neurovascular bundles bilaterally. The vascular pedicles of the prostate were then ligated with Weck clips between the prostate and neurovascular bundles and divided with sharp cold scissor dissection resulting in neurovascular bundle preservation. The neurovascular bundles were then separated off the apex of the prostate and urethra bilaterally.  The urethra was then sharply transected allowing the prostate specimen to be disarticulated. The pelvis was copiously irrigated and hemostasis was ensured. There was no evidence for rectal injury.  Attention then turned to the right pelvic sidewall. The fibrofatty tissue between the external iliac vein, confluence of the iliac vessels, hypogastric artery, and Cooper's ligament was dissected free from the pelvic sidewall with care to preserve the obturator nerve. Weck clips were used for lymphostasis and hemostasis. An identical procedure was performed on the contralateral side and the lymphatic packets were removed for permanent pathologic analysis.  Attention then turned to the urethral anastomosis. A 2-0 Vicryl slip knot was placed between Denonvillier's fascia, the posterior bladder neck, and the posterior urethra to reapproximate these structures. A double-armed 3-0 Monocryl suture was then used to perform a 360 running tension-free anastomosis between the bladder neck and urethra. A new urethral catheter was then placed into the bladder and irrigated. There were no blood clots within the bladder and the anastomosis appeared to be watertight. A #19 Blake drain was then brought through the left lateral 8 mm port site and positioned appropriately within the pelvis.  It was secured to the skin with a nylon suture. The surgical cart was then undocked. The right lateral 12 mm port site was closed at the fascial level with a 0 Vicryl suture placed laparoscopically. All remaining ports were then removed under direct vision. The prostate specimen was removed intact within the Endopouch retrieval bag via the periumbilical camera port site. This fascial opening was closed with two running 0 Vicryl sutures. 0.25% Marcaine was then injected into all port sites and all incisions were reapproximated at the skin level with 4-0 Monocryl subcuticular sutures and Dermabond. The patient appeared to tolerate the procedure well and without complications. The patient was able to be extubated and transferred to the recovery unit in satisfactory condition.   Pryor Curia MD

## 2015-02-10 NOTE — Anesthesia Procedure Notes (Signed)
Procedure Name: Intubation Date/Time: 02/10/2015 7:15 AM Performed by: Lind Covert Pre-anesthesia Checklist: Patient identified, Patient being monitored, Emergency Drugs available, Timeout performed and Suction available Patient Re-evaluated:Patient Re-evaluated prior to inductionOxygen Delivery Method: Circle system utilized Intubation Type: IV induction Laryngoscope Size: Mac and 4 Grade View: Grade II Tube type: Oral Tube size: 7.5 mm Number of attempts: 1 Airway Equipment and Method: Stylet Secured at: 23 cm Tube secured with: Tape Dental Injury: Teeth and Oropharynx as per pre-operative assessment

## 2015-02-10 NOTE — Transfer of Care (Signed)
Immediate Anesthesia Transfer of Care Note  Patient: Paul Kennedy  Procedure(s) Performed: Procedure(s): ROBOTIC ASSISTED LAPAROSCOPIC RADICAL PROSTATECTOMY LEVEL 2 (N/A) PELVIC LYMPHADENECTOMY (Bilateral)  Patient Location: PACU  Anesthesia Type:General  Level of Consciousness: sedated  Airway & Oxygen Therapy: Patient Spontanous Breathing and Patient connected to face mask oxygen  Post-op Assessment: Report given to RN and Post -op Vital signs reviewed and stable  Post vital signs: Reviewed and stable  Last Vitals:  Filed Vitals:   02/10/15 0542  BP: 135/85  Pulse: 72  Temp: 36.7 C  Resp: 16    Complications: No apparent anesthesia complications

## 2015-02-10 NOTE — Progress Notes (Signed)
Post-op note  Subjective: The patient is doing well.  No complaints.  Denies N/V.  Very sleepy   Objective: Vital signs in last 24 hours: Temp:  [96.8 F (36 C)-98.7 F (37.1 C)] 97.6 F (36.4 C) (06/02 1309) Pulse Rate:  [68-75] 74 (06/02 1309) Resp:  [12-17] 14 (06/02 1309) BP: (124-156)/(73-92) 146/87 mmHg (06/02 1309) SpO2:  [94 %-100 %] 100 % (06/02 1309) Weight:  [97.693 kg (215 lb 6 oz)] 97.693 kg (215 lb 6 oz) (06/02 0623)  Intake/Output from previous day:   Intake/Output this shift: Total I/O In: 1950 [I.V.:1950] Out: 580 [Urine:50; Drains:30; Blood:500]  Physical Exam:  General: Alert and oriented. Abdomen: Soft, Nondistended. Incisions: Clean and dry. Urine: red  Lab Results:  Recent Labs  02/08/15 1130 02/10/15 1205  HGB 13.6 12.2*  HCT 39.8 35.7*    Assessment/Plan: POD#0   1) Continue to monitor  2) DVT prophy, clears, IS, amb, pain control   LOS: 0 days   Aric Jost 02/10/2015, 3:46 PM

## 2015-02-11 ENCOUNTER — Encounter (HOSPITAL_COMMUNITY): Payer: Self-pay | Admitting: Urology

## 2015-02-11 LAB — HEMOGLOBIN AND HEMATOCRIT, BLOOD
HCT: 28.7 % — ABNORMAL LOW (ref 39.0–52.0)
HCT: 31.8 % — ABNORMAL LOW (ref 39.0–52.0)
Hemoglobin: 9.8 g/dL — ABNORMAL LOW (ref 13.0–17.0)
Hemoglobin: 11 g/dL — ABNORMAL LOW (ref 13.0–17.0)

## 2015-02-11 MED ORDER — ONDANSETRON HCL 4 MG/2ML IJ SOLN
4.0000 mg | Freq: Four times a day (QID) | INTRAMUSCULAR | Status: DC | PRN
  Administered 2015-02-11: 4 mg via INTRAVENOUS
  Filled 2015-02-11: qty 2

## 2015-02-11 NOTE — Discharge Summary (Signed)
Date of admission: 02/10/2015  Date of discharge: 02/11/2015  Admission diagnosis: Prostate Cancer  Discharge diagnosis: Prostate Cancer  Secondary diagnoses: None  History and Physical: For full details, please see admission history and physical. Briefly, Paul Kennedy is a 69 y.o. year old patient with Prostate cancer.   Hospital Course: The patient underwent robotic laparoscopic radical prostatectomy with bilateral pelvic lymph node dissection. They were taken to PACU for routine recovery before transfer to the floor. They were maintained on a clear liquid diet. Their pain was adequately controlled on PO pain pills. They were able to ambulate. JP drain was removed on POD#1. They received foley catheter teaching. They were deemed suitable for discharge on POD#1.  Laboratory values:  Recent Labs  02/10/15 1205 02/11/15 0423 02/11/15 1151  HGB 12.2* 9.8* 11.0*  HCT 35.7* 28.7* 31.8*   No results for input(s): CREATININE in the last 72 hours.  Disposition: Home  Discharge instruction: The patient was instructed to be ambulatory but told to refrain from heavy lifting, strenuous activity, or driving.   Discharge medications:    Medication List    STOP taking these medications        aspirin 81 MG tablet     Celery Seed Oil     CoQ10 100 MG Caps     FISH OIL PO     multivitamin capsule     SELENIUM-VITAMIN E PO     VIAGRA 100 MG tablet  Generic drug:  sildenafil     VITAMIN B COMPLEX PO     Vitamin D (Cholecalciferol) 1000 UNITS Tabs     vitamin E 100 UNIT capsule      TAKE these medications        COMBIGAN 0.2-0.5 % ophthalmic solution  Generic drug:  brimonidine-timolol  Place 1 drop into both eyes 2 (two) times daily.     hydrochlorothiazide 25 MG tablet  Commonly known as:  HYDRODIURIL  Take 25 mg by mouth daily.     HYDROcodone-acetaminophen 5-325 MG per tablet  Commonly known as:  NORCO  Take 1-2 tablets by mouth every 6 (six) hours as needed.     potassium chloride SA 20 MEQ tablet  Commonly known as:  K-DUR,KLOR-CON  Take 20 mEq by mouth 2 (two) times daily.     sulfamethoxazole-trimethoprim 800-160 MG per tablet  Commonly known as:  BACTRIM DS,SEPTRA DS  Take 1 tablet by mouth 2 (two) times daily. Start the day prior to foley removal appointment        Followup:      Follow-up Information    Follow up with Dutch Gray, MD On 02/18/2015.   Specialty:  Urology   Why:  at 12:45   Contact information:   [place] [id number] [phone]

## 2015-02-11 NOTE — Care Management Note (Signed)
Case Management Note  Patient Details  Name: Paul Kennedy MRN: [id number] Date of Birth: 1945/10/30  Subjective/Objective:   69 y/o m admitted w/prostate ca. From home.                 Action/Plan:No anticipated d/c needs.   Expected Discharge Date:  02/11/15               Expected Discharge Plan:  Home/Self Care  In-House Referral:     Discharge planning Services  CM Consult  Post Acute Care Choice:    Choice offered to:     DME Arranged:    DME Agency:     HH Arranged:    HH Agency:     Status of Service:  In process, will continue to follow  Medicare Important Message Given:    Date Medicare IM Given:    Medicare IM give by:    Date Additional Medicare IM Given:    Additional Medicare Important Message give by:     If discussed at [place] of Stay Meetings, dates discussed:    Additional Comments:  Dessa Phi, RN 02/11/2015, 12:46 PM

## 2015-02-11 NOTE — Progress Notes (Signed)
Completed D/C teaching with patient and family. Pt demonstrated leg bag and drainage bag usage. Answered all questions. Patient is being D/C home with family in stable condition.  Mercy Moore

## 2015-02-11 NOTE — Progress Notes (Signed)
1 Day Post-Op Subjective: The patient is doing well.  No nausea or vomiting. Pain is adequately controlled.  Has ambulated twice.  Objective: Vital signs in last 24 hours: Temp:  [96.8 F (36 C)-99 F (37.2 C)] 98.9 F (37.2 C) (06/03 0422) Pulse Rate:  [67-80] 67 (06/03 0422) Resp:  [12-18] 18 (06/03 0422) BP: (114-156)/(57-92) 115/57 mmHg (06/03 0422) SpO2:  [94 %-100 %] 100 % (06/03 0422)  Intake/Output from previous day: 06/02 0701 - 06/03 0700 In: 3820 [P.O.:240; I.V.:3480; IV Piggyback:100] Out: 8264 [Urine:1000; Drains:196; Blood:500] Intake/Output this shift:    Physical Exam:  General: Alert and oriented. CV: RRR Lungs: Clear bilaterally. GI: Soft, Nondistended. Incisions: Clean, dry, and intact Urine: Clear Extremities: Nontender, no erythema, no edema.  Lab Results:  Recent Labs  02/08/15 1130 02/10/15 1205 02/11/15 0423  HGB 13.6 12.2* 9.8*  HCT 39.8 35.7* 28.7*      Assessment/Plan: POD# 1 s/p robotic prostatectomy.  Overall recovering well.  Hgb dropped slightly more than average so we will recheck.  Otherwise on pathway and doing well.  Likely discharge later today.  1) SL IVF 2) Ambulate, Incentive spirometry 3) Transition to oral pain medication 4) Dulcolax suppository 5) D/C pelvic drain 6) Repeat H&H given decline overnight 7) Plan for likely discharge later today

## 2015-02-13 ENCOUNTER — Emergency Department (HOSPITAL_COMMUNITY)
Admission: EM | Admit: 2015-02-13 | Discharge: 2015-02-13 | Disposition: A | Discharge status: Home / Self Care | Payer: 59 | Attending: Emergency Medicine | Admitting: Emergency Medicine

## 2015-02-13 ENCOUNTER — Encounter (HOSPITAL_COMMUNITY): Payer: Self-pay | Admitting: Emergency Medicine

## 2015-02-13 DIAGNOSIS — Z87891 Personal history of nicotine dependence: Secondary | ICD-10-CM | POA: Diagnosis not present

## 2015-02-13 DIAGNOSIS — H409 Unspecified glaucoma: Secondary | ICD-10-CM | POA: Diagnosis not present

## 2015-02-13 DIAGNOSIS — Z8546 Personal history of malignant neoplasm of prostate: Secondary | ICD-10-CM | POA: Insufficient documentation

## 2015-02-13 DIAGNOSIS — G4733 Obstructive sleep apnea (adult) (pediatric): Secondary | ICD-10-CM | POA: Diagnosis not present

## 2015-02-13 DIAGNOSIS — K5900 Constipation, unspecified: Secondary | ICD-10-CM | POA: Insufficient documentation

## 2015-02-13 DIAGNOSIS — Z9079 Acquired absence of other genital organ(s): Secondary | ICD-10-CM | POA: Insufficient documentation

## 2015-02-13 DIAGNOSIS — Z4801 Encounter for change or removal of surgical wound dressing: Secondary | ICD-10-CM | POA: Insufficient documentation

## 2015-02-13 DIAGNOSIS — L24A9 Irritant contact dermatitis due friction or contact with other specified body fluids: Secondary | ICD-10-CM

## 2015-02-13 DIAGNOSIS — M069 Rheumatoid arthritis, unspecified: Secondary | ICD-10-CM | POA: Insufficient documentation

## 2015-02-13 DIAGNOSIS — Z79899 Other long term (current) drug therapy: Secondary | ICD-10-CM | POA: Insufficient documentation

## 2015-02-13 DIAGNOSIS — I10 Essential (primary) hypertension: Secondary | ICD-10-CM | POA: Insufficient documentation

## 2015-02-13 DIAGNOSIS — E669 Obesity, unspecified: Secondary | ICD-10-CM | POA: Diagnosis not present

## 2015-02-13 DIAGNOSIS — Z9981 Dependence on supplemental oxygen: Secondary | ICD-10-CM | POA: Diagnosis not present

## 2015-02-13 DIAGNOSIS — Z792 Long term (current) use of antibiotics: Secondary | ICD-10-CM | POA: Diagnosis not present

## 2015-02-13 DIAGNOSIS — T148XXA Other injury of unspecified body region, initial encounter: Secondary | ICD-10-CM

## 2015-02-13 MED ORDER — POLYETHYLENE GLYCOL 3350 17 G PO PACK: 17.0000 g | PACK | Freq: Every day | ORAL | Status: DC

## 2015-02-13 NOTE — ED Notes (Signed)
Patient had prostatectomy Thursday, jp removed Friday. Surgeon on call told him drainage was wnl.  Patient and wife concerned about infection.  Serosanguinous drainage not to gauze and towel on left abdomen.

## 2015-02-13 NOTE — ED Notes (Signed)
Went in to stick patient and patient really does not want blood work done.  I made nurse aware.

## 2015-02-13 NOTE — ED Notes (Signed)
Wound redressed with 3 4X4 gauze.  Approximately 1 inch surgical wound, no signs of infection.

## 2015-02-13 NOTE — ED Provider Notes (Signed)
CSN: [id number]     Arrival date & time 02/13/15  1049 History   First MD Initiated Contact with Patient 02/13/15 1152     Chief Complaint  Patient presents with  . Wound Check    HPI The patient  Presents to the emergency room for evaluation of persistent wound drainage. Patient had a robotic assisted laparoscopic radical prostatectomy performed on June 2 by Dr. Alinda Money.  The patient had a JP drain removed on Friday. He started noticing increased drainage yesterday as well as today. Calleded the surgeon on call and was told that it was most likely within normal limits. Patient was still concerned though this morning when he continued to notice the drainage so he decided to come in for evaluation. Denies any trouble with fevers or chills. No difficulty urinating. No vomiting or diarrhea. Past Medical History  Diagnosis Date  . Obesity   . HTN (hypertension)   . Prostate cancer   . Glaucoma   . Arthritis   . OSA (obstructive sleep apnea)     cpap, pt does not know setting  . PONV (postoperative nausea and vomiting) 1980's   Past Surgical History  Procedure Laterality Date  . Colonoscopy  08/2011    repeat in 5 year,2 tubular adenomas-Dr Michail Sermon  . Prostate biopsy    . Tonsillectomy  as child  . Removal of undescended testicle Right 1980's  . Robot assisted laparoscopic radical prostatectomy N/A 02/10/2015    Procedure: ROBOTIC ASSISTED LAPAROSCOPIC RADICAL PROSTATECTOMY LEVEL 2;  Surgeon: Raynelle Bring, MD;  Location: [facility];  Service: Urology;  Laterality: N/A;  . Lymphadenectomy Bilateral 02/10/2015    Procedure: PELVIC LYMPHADENECTOMY;  Surgeon: Raynelle Bring, MD;  Location: [facility];  Service: Urology;  Laterality: Bilateral;   Family History  Problem Relation Age of Onset  . Hypertension Mother   . Diabetes Mother     Type II  . Hypertension Sister   . Diabetes Sister     type II  . Diabetes Sister    History  Substance Use Topics  . Smoking status: Former Smoker -- 0.50  packs/day for 15 years    Types: Cigarettes    Quit date: 09/10/1986  . Smokeless tobacco: Former Systems developer    Types: Chew  . Alcohol Use: No     Comment: 1980's hx of beer use     Review of Systems  All other systems reviewed and are negative.     Allergies  Review of patient's allergies indicates no known allergies.  Home Medications   Prior to Admission medications   Medication Sig Start Date End Date Taking? Authorizing Provider  COMBIGAN 0.2-0.5 % ophthalmic solution Place 1 drop into both eyes 2 (two) times daily.  01/13/15  Yes Historical Provider, MD  hydrochlorothiazide (HYDRODIURIL) 25 MG tablet Take 25 mg by mouth daily.   Yes Historical Provider, MD  HYDROcodone-acetaminophen (NORCO) 5-325 MG per tablet Take 1-2 tablets by mouth every 6 (six) hours as needed. Patient taking differently: Take 1-2 tablets by mouth every 6 (six) hours as needed for moderate pain.  02/10/15  Yes Amanda Dancy, PA-C  potassium chloride SA (K-DUR,KLOR-CON) 20 MEQ tablet Take 20 mEq by mouth 2 (two) times daily.   Yes Historical Provider, MD  polyethylene glycol (MIRALAX / GLYCOLAX) packet Take 17 g by mouth daily. 02/13/15   Dorie Rank, MD  sulfamethoxazole-trimethoprim (BACTRIM DS,SEPTRA DS) 800-160 MG per tablet Take 1 tablet by mouth 2 (two) times daily. Start the day prior to foley  removal appointment 02/10/15   Debbrah Alar, PA-C   BP 148/77 mmHg  Pulse 71  Temp(Src) 98.4 F (36.9 C) (Oral)  Resp 14  SpO2 99% Physical Exam  Constitutional: He appears well-developed and well-nourished. No distress.  HENT:  Head: Normocephalic and atraumatic.  Right Ear: External ear normal.  Left Ear: External ear normal.  Eyes: Conjunctivae are normal. Right eye exhibits no discharge. Left eye exhibits no discharge. No scleral icterus.  Neck: Neck supple. No tracheal deviation present.  Cardiovascular: Normal rate, regular rhythm and intact distal pulses.   Pulmonary/Chest: Effort normal and breath sounds  normal. No stridor. No respiratory distress. He has no wheezes. He has no rales.  Abdominal: Soft. Bowel sounds are normal. He exhibits no distension. There is no tenderness. There is no rebound and no guarding.  Small 1 cm sutured wound left upper abdomen area, no fluctuance, no erythema, no purulent drainage, serous fluid noted on the gauze covering the wound  Musculoskeletal: He exhibits no edema or tenderness.  Neurological: He is alert. He has normal strength. No cranial nerve deficit (no facial droop, extraocular movements intact, no slurred speech) or sensory deficit. He exhibits normal muscle tone. He displays no seizure activity. Coordination normal.  Skin: Skin is warm and dry. No rash noted.  Psychiatric: He has a normal mood and affect.  Nursing note and vitals reviewed.   ED Course  Procedures (including critical care time) Labs Review Labs Reviewed  CBC WITH DIFFERENTIAL/PLATELET  BASIC METABOLIC PANEL      MDM   Final diagnoses:  Drainage from wound    Initially planned on checking CBC and BMET.  Pt does not want any additional testing.  Wound does appear to have serous drainage only.  I doubt infection.  Most likely related drainage associated with his surgery.   Draining from the wound now as he is more active.  Pt also requests some medication for his constipation.  Colace and prune juice have not helped Will notify on call urology.  Pt appears stable for outpatient follow up.    Dorie Rank, MD 02/13/15 1250

## 2015-02-13 NOTE — ED Notes (Signed)
Patient has declined having venipuncture.  MD informed.

## 2015-02-14 ENCOUNTER — Telehealth: Payer: Self-pay | Admitting: Medical Oncology

## 2015-02-14 ENCOUNTER — Encounter: Payer: Self-pay | Admitting: Medical Oncology

## 2015-02-14 NOTE — Telephone Encounter (Signed)
Paul Kennedy had a robotic prostatectomy 02/10/15. He states he is doing well except the place they pulled the drain continues to drain. He was not aware it was going to drain so much and he got nervous and went to the ED yesterday. I explained it will take it a few days and it will stop but to continue to change the dressing. He states other than that he is doing well. I asked him to call me with any questions or concerns. He voiced understanding.   Cira Rue, RN, BSN, [place]  [phone]  Fax [phone]

## 2015-02-14 NOTE — Progress Notes (Signed)
Oncology Nurse Navigator Documentation  Oncology Nurse Navigator Flowsheets 02/14/2015  Navigator Encounter Type Telephone  Patient Visit Type Surgery- Pt had robotic prostatectomy 6/02. He is doing well. Went to the ER yesterday with drainage from one of the incision. We discussed this is where the drain was and it will drain for a few days. He states he did not remember that and got nervous so he went to the ER. I asked him to call me with any questions or concerns. He voiced understanding and thanked me for calling him.  Support Groups/Services Prostate  Time Spent with Patient 15

## 2015-04-26 ENCOUNTER — Telehealth: Payer: Self-pay | Admitting: Medical Oncology

## 2015-04-26 NOTE — Telephone Encounter (Signed)
Oncology Nurse Navigator Documentation  Oncology Nurse Navigator Flowsheets 02/14/2015 04/26/2015  Navigator Encounter Type Telephone Telephone;3 month- I left a message requesting Paul Kennedy to return my call regarding 3 month follow up Prostate [place] visit.  Support Groups/Services Prostate -  Time Spent with Patient - 30

## 2015-05-12 ENCOUNTER — Telehealth: Payer: Self-pay | Admitting: Medical Oncology

## 2015-05-12 NOTE — Telephone Encounter (Signed)
Oncology Nurse Navigator Documentation  Oncology Nurse Navigator Flowsheets 02/14/2015 04/26/2015 05/12/2015  Navigator Encounter Type Telephone Telephone;3 month Telephone- Left a message for patient to return my call. I am following up 3 months post prostatectomy.  Support Groups/Services Prostate - -  Time Spent with Patient - 18 29

## 2015-05-12 NOTE — Telephone Encounter (Signed)
Oncology Nurse Navigator Documentation  Oncology Nurse Navigator Flowsheets 04/26/2015 05/12/2015 05/12/2015  Navigator Encounter Type Telephone;3 month Telephone Telephone;3 month- Paul Kennedy states he is doing well. He is recovering and getting back to normal. He has no complaints and is very thankful for all the care he has received. He states he is going to continue to care for himself the best he can. I asked him to call me with any questions or concerns. I will follow up with in i 6 months.  Barriers/Navigation Needs - - No barriers at this time  Interventions - - None required  Support Groups/Services - - Friends and Family  Time Spent with Patient [phone]

## 2015-08-02 ENCOUNTER — Telehealth: Payer: Self-pay | Admitting: Medical Oncology

## 2015-08-02 NOTE — Telephone Encounter (Signed)
Oncology Nurse Navigator Documentation  Oncology Nurse Navigator Flowsheets 05/12/2015 05/12/2015 08/02/2015  Navigator Encounter Type Telephone Telephone;3 month Telephone;6 month-Left a message with Mr. Nutall to return my call. Following up with him 6 months post prostatectomy.  Barriers/Navigation Needs - No barriers at this time -  Interventions - None required -  Support Groups/Services - Friends and Family -  Time Spent with Patient 15 15 (No Data)

## 2015-10-19 ENCOUNTER — Other Ambulatory Visit: Payer: Self-pay | Admitting: *Deleted

## 2015-10-19 ENCOUNTER — Other Ambulatory Visit: Payer: Self-pay | Admitting: Family Medicine

## 2015-10-19 DIAGNOSIS — Z136 Encounter for screening for cardiovascular disorders: Secondary | ICD-10-CM

## 2015-10-21 IMAGING — NM NM BONE WHOLE BODY
2 series · 2 of 2 positions shown · non-contrast
Comparison: None.

CLINICAL DATA: New diagnosis of prostate malignancy

EXAM:
NUCLEAR MEDICINE WHOLE BODY BONE SCAN
TECHNIQUE: Whole body anterior and posterior images were obtained approximately
3 hours after intravenous injection of radiopharmaceutical.
RADIOPHARMACEUTICALS:  26.0 mCi Vechnetium-WW MDP

[Series 1: wbr_bone_60 whole body · 2.66mm/px · 1 of 1 slices shown (1 of 2)]
[im 1/1]
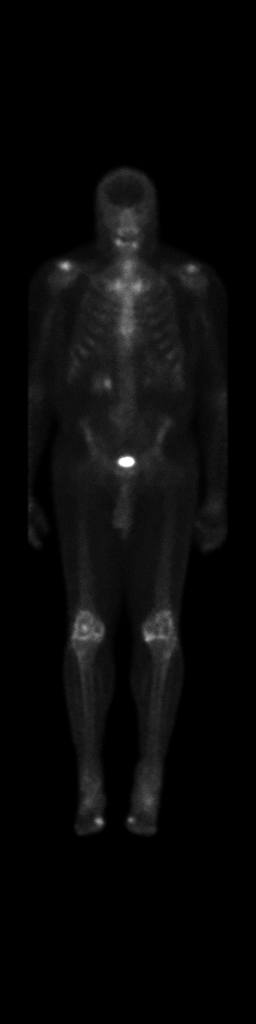

[Series 1: wbr_bone_60 whole body · 2.66mm/px · 1 of 1 slices shown (2 of 2)]
[im 1/1]
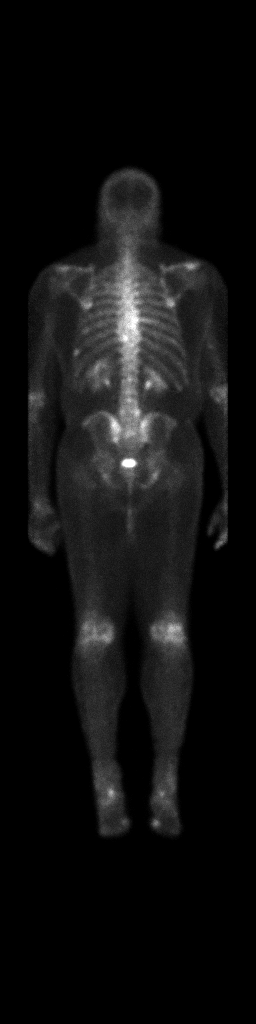

[2 of 2 positions shown; findings below may reference images not displayed]

FINDINGS: There is adequate uptake of the radiopharmaceutical by the skeleton.
There is adequate soft tissue clearance and renal activity.

Skull: Uptake of the radiopharmaceutical within the skull was
normal. Spine: There is patchy low level increased uptake in the mid
thoracic spine. The most focal area is at approximately T9 or T10.
There is minimal increased uptake to the right at L4-5 which is
likely reflects degenerative change given its ec-centric position.

Ribs: There is subtle increased uptake within the right seventh rib
posteriorly. There is focally increased uptake laterally in the
posterior aspect of the left eighth rib. Minimal patchy uptake
posteriorly in the left seventh rib is noted.

Pelvis: There is symmetrically increased uptake within the inferior
pubic rami posteriorly, bilaterally.

Upper lower extremities: Focal uptake in the AC joints is noted
bilaterally. Increased uptake about both knees in within the feet is
present and consistent with degenerative change.
IMPRESSION: Increased uptake in the mid thoracic spine, L4-L5 on the right, the
inferior pubic rami bilaterally and in multiple ribs is worrisome
for metastatic disease. Plain films of these regions are
recommended.

## 2015-12-27 IMAGING — CR DG CHEST 2V
2 series · 2 of 2 positions shown · non-contrast
Comparison: None.

CLINICAL DATA: Preoperative exam prior prostatectomy, no chest
complaints, nonsmoker.

EXAM:
CHEST  2 VIEW

[w chest pa]
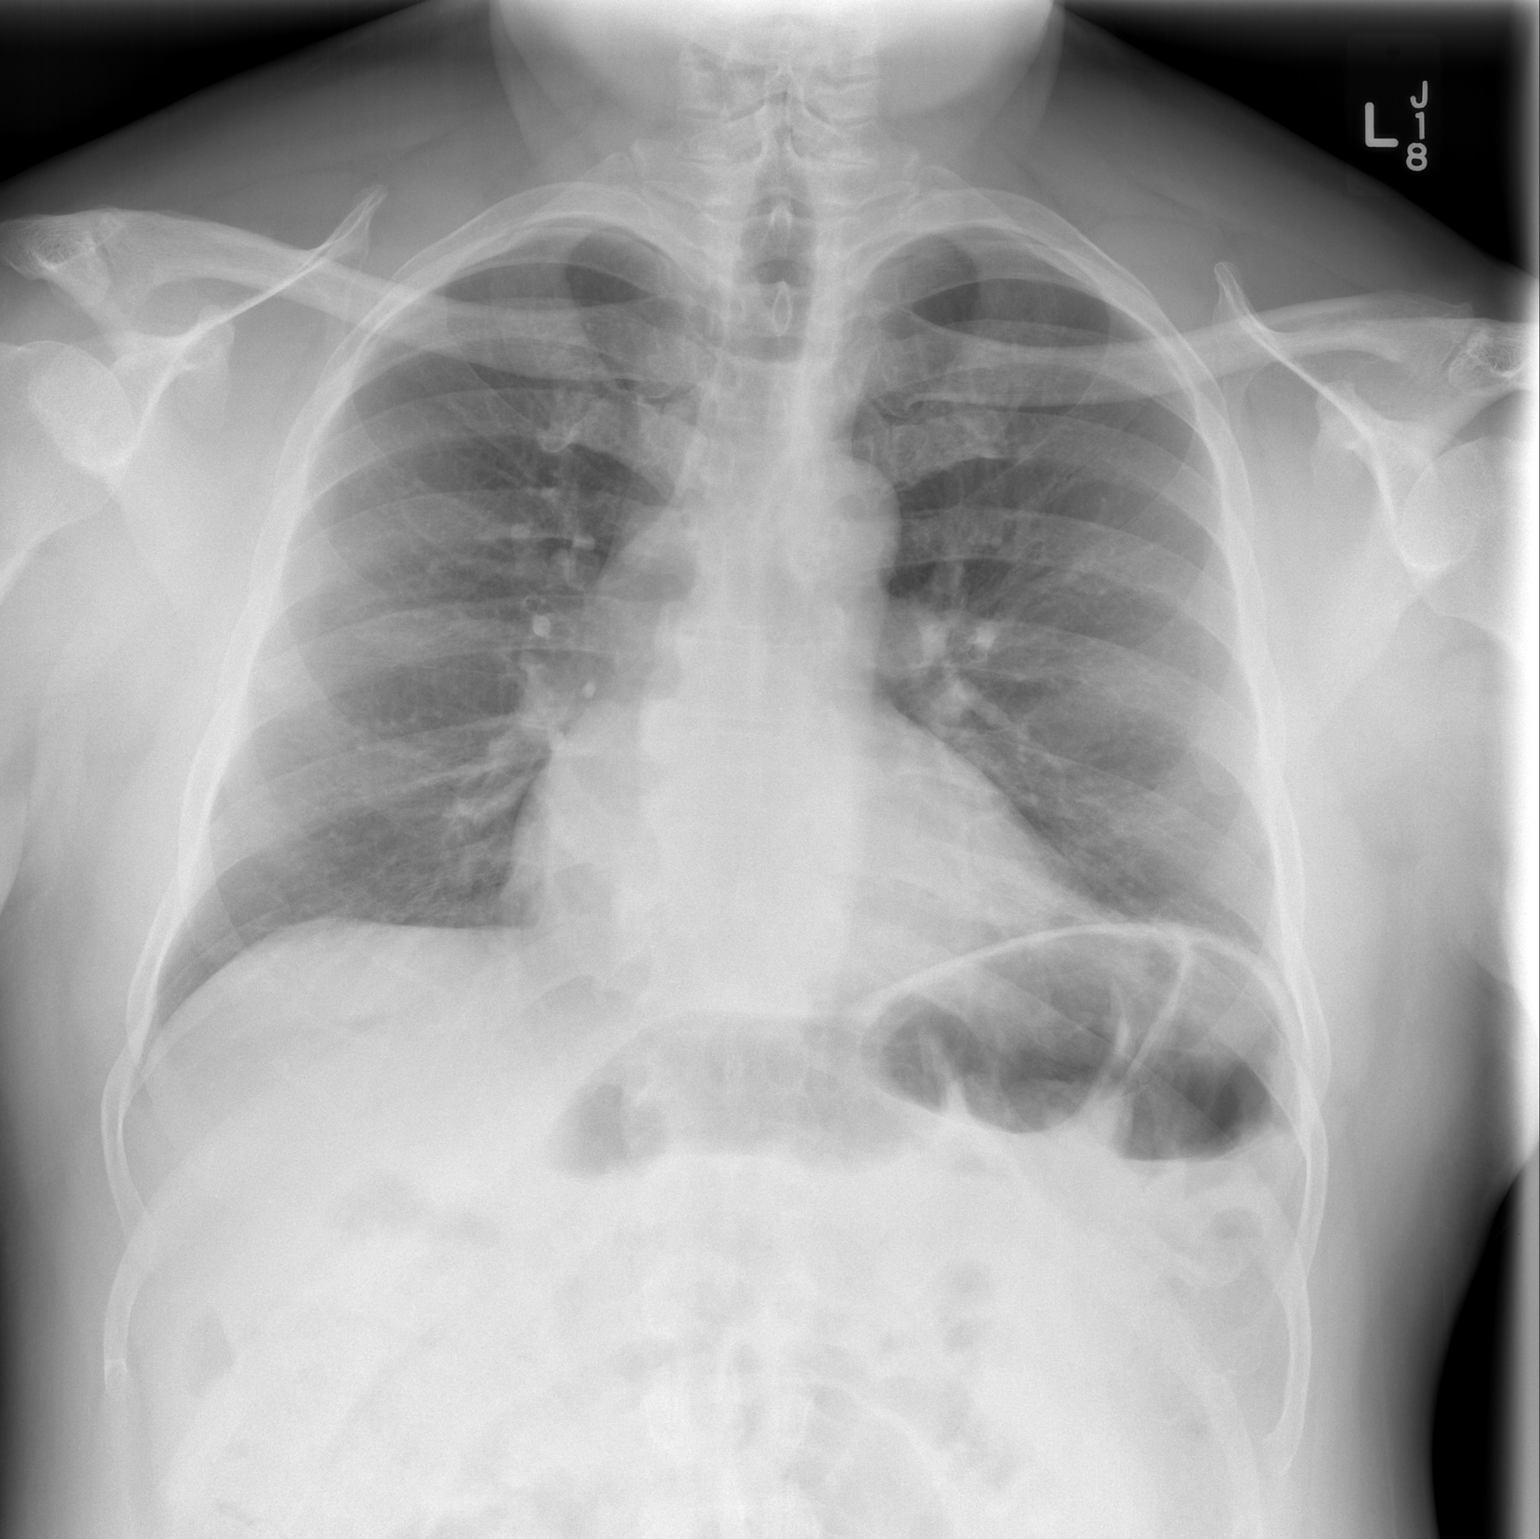

[w chest lat]
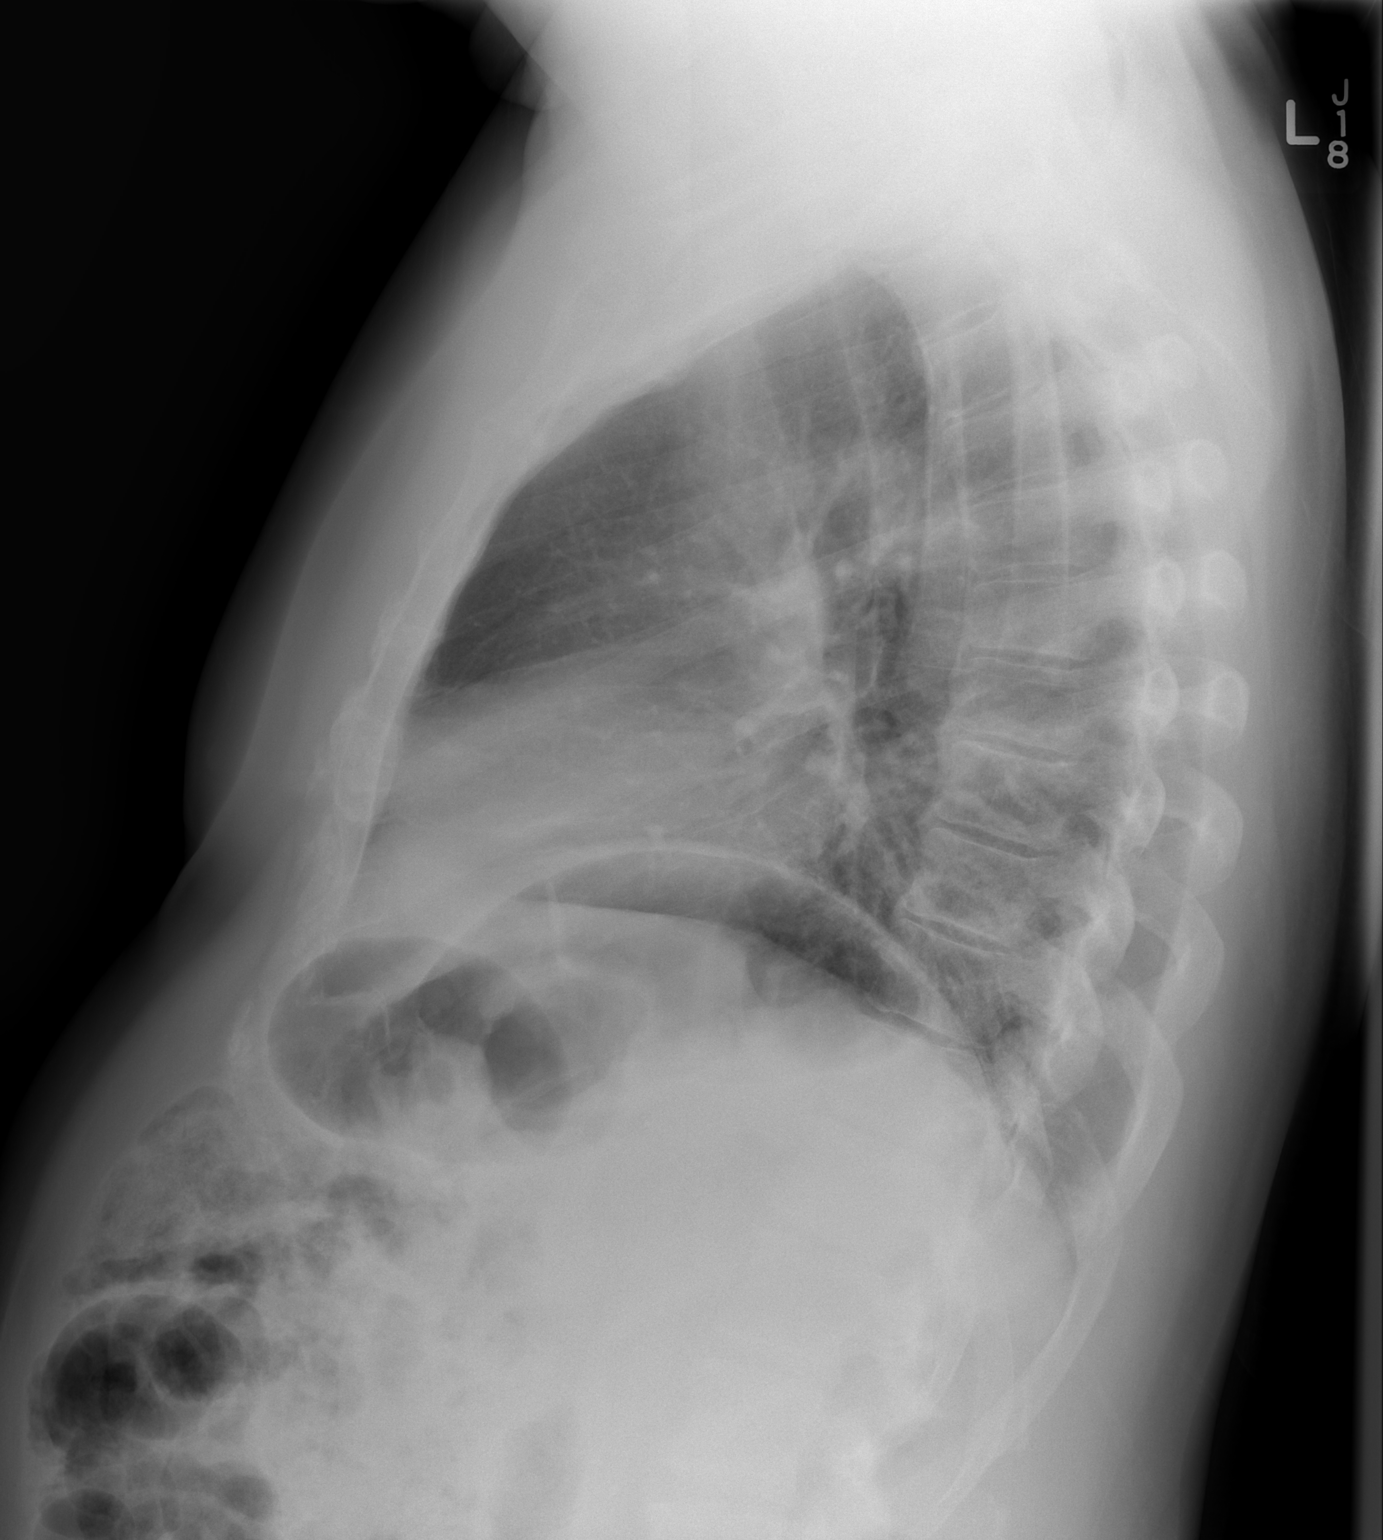

[2 of 2 positions shown; findings below may reference images not displayed]

FINDINGS: The lungs are adequately inflated and clear. The heart and pulmonary
vascularity are normal. The mediastinum is normal in width. The
trachea is midline. There is no pleural effusion. The bony thorax
exhibits no acute abnormality.
IMPRESSION: There is no active cardiopulmonary disease.

## 2018-01-24 ENCOUNTER — Ambulatory Visit (INDEPENDENT_AMBULATORY_CARE_PROVIDER_SITE_OTHER): Payer: Medicare Other | Admitting: Family Medicine

## 2018-01-24 ENCOUNTER — Ambulatory Visit (INDEPENDENT_AMBULATORY_CARE_PROVIDER_SITE_OTHER)
Admission: RE | Admit: 2018-01-24 | Discharge: 2018-01-24 | Disposition: A | Discharge status: Home / Self Care | Payer: Medicare Other | Source: Ambulatory Visit | Attending: Family Medicine | Admitting: Family Medicine

## 2018-01-24 ENCOUNTER — Ambulatory Visit: Payer: Self-pay

## 2018-01-24 ENCOUNTER — Encounter: Payer: Self-pay | Admitting: Family Medicine

## 2018-01-24 ENCOUNTER — Encounter

## 2018-01-24 VITALS — BP 148/102 | HR 64 | Ht 68.0 in | Wt 226.0 lb

## 2018-01-24 DIAGNOSIS — M25561 Pain in right knee: Principal | ICD-10-CM

## 2018-01-24 DIAGNOSIS — M25562 Pain in left knee: Secondary | ICD-10-CM

## 2018-01-24 DIAGNOSIS — M545 Low back pain, unspecified: Secondary | ICD-10-CM | POA: Insufficient documentation

## 2018-01-24 DIAGNOSIS — M170 Bilateral primary osteoarthritis of knee: Secondary | ICD-10-CM | POA: Diagnosis not present

## 2018-01-24 DIAGNOSIS — G8929 Other chronic pain: Secondary | ICD-10-CM

## 2018-01-24 DIAGNOSIS — M7021 Olecranon bursitis, right elbow: Secondary | ICD-10-CM | POA: Diagnosis not present

## 2018-01-24 NOTE — Assessment & Plan Note (Signed)
Bilateral arthritis.  Discussed with patient about icing regimen, home exercise, which activities to do which was to avoid.  Patient was to increase activity as tolerated.  Patient will try topical anti-inflammatories.  Patient will follow-up in 4 weeks

## 2018-01-24 NOTE — Assessment & Plan Note (Signed)
Patient will continue compression, topical anti-inflammatories and monitor.  Worsening symptoms we will consider aspiration

## 2018-01-24 NOTE — Progress Notes (Signed)
[facility] [place], [place] [address] Phone: [phone] Subjective:     CC: Right elbow pain, bilateral knee pain  RXV:[id number]  Paul Kennedy is a 72 y.o. male coming in with complaint of knee and elbow pain.  Has been having right elbow pain. Patient has been having swelling in the elbow for 3 weeks. He does not recall an injury. Patient only has pain when he leans on his elbow.  Patient states that still seems to be swollen but not as severe as what it was previously.  Patient also has bilateral knee pain that has been going on for years. Most of his pain is in the anterior knee. Treadmill makes it worse as well as prolonged sitting. Feels that that he also has decreased ROM. Has not ever had injections in knees.    Also some low back pain.  Describes it as a dull aching sensation.  No radiation down the legs or any numbness or weakness.  Patient states that it is always has a dull ache most days of the week at least.  Sometimes can be severe enough that stops him from some activities.  Denies any nighttime awakening.  Past Medical History:  Diagnosis Date  . Arthritis   . Glaucoma   . HTN (hypertension)   . Obesity   . OSA (obstructive sleep apnea)    cpap, pt does not know setting  . PONV (postoperative nausea and vomiting) 1980's  . Prostate cancer [facility])    Past Surgical History:  Procedure Laterality Date  . COLONOSCOPY  08/2011   repeat in 5 year,2 tubular adenomas-Dr Michail Sermon  . LYMPHADENECTOMY Bilateral 02/10/2015   Procedure: PELVIC LYMPHADENECTOMY;  Surgeon: Raynelle Bring, MD;  Location: [facility];  Service: Urology;  Laterality: Bilateral;  . PROSTATE BIOPSY    . removal of undescended testicle Right 1980's  . ROBOT ASSISTED LAPAROSCOPIC RADICAL PROSTATECTOMY N/A 02/10/2015   Procedure: ROBOTIC ASSISTED LAPAROSCOPIC RADICAL PROSTATECTOMY LEVEL 2;  Surgeon: Raynelle Bring, MD;  Location: [facility];  Service: Urology;  Laterality: N/A;    . TONSILLECTOMY  as child   Social History   Socioeconomic History  . Marital status: Married    Spouse name: Not on file  . Number of children: Not on file  . Years of education: Not on file  . Highest education level: Not on file  Occupational History  . Not on file  Social Needs  . Financial resource strain: Not on file  . Food insecurity:    Worry: Not on file    Inability: Not on file  . Transportation needs:    Medical: Not on file    Non-medical: Not on file  Tobacco Use  . Smoking status: Former Smoker    Packs/day: 0.50    Years: 15.00    Pack years: 7.50    Types: Cigarettes    Last attempt to quit: 09/10/1986    Years since quitting: 31.3  . Smokeless tobacco: Former Systems developer    Types: Chew  Substance and Sexual Activity  . Alcohol use: No    Comment: 1980's hx of beer use   . Drug use: No  . Sexual activity: Yes  Lifestyle  . Physical activity:    Days per week: Not on file    Minutes per session: Not on file  . Stress: Not on file  Relationships  . Social connections:    Talks on phone: Not on file    Gets  together: Not on file    Attends religious service: Not on file    Active member of club or organization: Not on file    Attends meetings of clubs or organizations: Not on file    Relationship status: Not on file  Other Topics Concern  . Not on file  Social History Narrative  . Not on file   No Known Allergies Family History  Problem Relation Age of Onset  . Hypertension Mother   . Diabetes Mother        Type II  . Hypertension Sister   . Diabetes Sister        type II  . Diabetes Sister      Past medical history, social, surgical and family history all reviewed in electronic medical record.  No pertanent information unless stated regarding to the chief complaint.   Review of Systems:Review of systems updated and as accurate as of 01/24/18  No headache, visual changes, nausea, vomiting, diarrhea, constipation, dizziness, abdominal pain,  skin rash, fevers, chills, night sweats, weight loss, swollen lymph nodes, body aches, joint swelling, muscle aches, chest pain, shortness of breath, mood changes.   Objective  Blood pressure (!) 148/102, pulse 64, height 5\' 8"  (1.727 m), weight 226 lb (102.5 kg), SpO2 95 %. Systems examined below as of 01/24/18   General: No apparent distress alert and oriented x3 mood and affect normal, dressed appropriately.  HEENT: Pupils equal, extraocular movements intact  Respiratory: Patient's speak in full sentences and does not appear short of breath  Cardiovascular: No lower extremity edema, non tender, no erythema  Skin: Warm dry intact with no signs of infection or rash on extremities or on axial skeleton.  Abdomen: Soft nontender  Neuro: Cranial nerves II through XII are intact, neurovascularly intact in all extremities with 2+ DTRs and 2+ pulses.  Lymph: No lymphadenopathy of posterior or anterior cervical chain or axillae bilaterally.  Gait normal with good balance and coordination.  MSK:  Non tender with full range of motion and good stability and symmetric strength and tone of shoulders,  wrist, hip, and ankles bilaterally.  Right elbow exam shows the patient does have swelling over the olecranon bursa.  Minimally tender to palpation.  Full range of motion of the elbow with full strength noted.  Knee: Bilateral valgus deformity noted.  Abnormal thigh to calf ratio.  Tender to palpation over medial and PF joint line.  ROM full in flexion and extension and lower leg rotation. instability with valgus force.  painful patellar compression. Patellar glide with moderate crepitus. Patellar and quadriceps tendons unremarkable. Hamstring and quadriceps strength is normal.  Back exam shows some loss of lordosis.  Poor core strength.  Mild loss of range of motion in all planes.  Patient has severe tightness with Corky Sox test bilaterally.  Negative straight leg test.  Tender to palpation mostly in the  lumbosacral area.  Limited muscular skeletal ultrasound was performed and interpreted by Lyndal Pulley  Limited ultrasound of patient's elbow shows swelling noted in the olecranon bursa.  Compressible.  No increase in Doppler flow.  No abnormal surrounding soft tissue Impression: Olecranon bursitis  [id number]; 15 additional minutes spent for Therapeutic exercises as stated in above notes.  This included exercises focusing on stretching, strengthening, with significant focus on eccentric aspects.   Long term goals include an improvement in range of motion, strength, endurance as well as avoiding reinjury. Patient's frequency would include in 1-2 times a day, 3-5 times a week for  a duration of 6-12 weeks. Low back exercises that included:  Pelvic tilt/bracing instruction to focus on control of the pelvic girdle and lower abdominal muscles  Glute strengthening exercises, focusing on proper firing of the glutes without engaging the low back muscles Proper stretching techniques for maximum relief for the hamstrings, hip flexors, low back and some rotation where tolerated    Proper technique shown and discussed handout in great detail with ATC.  All questions were discussed and answered.     Impression and Recommendations:     This case required medical decision making of moderate complexity.      Note: This dictation was prepared with Dragon dictation along with smaller phrase technology. Any transcriptional errors that result from this process are unintentional.

## 2018-01-24 NOTE — Patient Instructions (Signed)
Good to see you.  Ice 20 minutes 2 times daily. Usually after activity and before bed. Exercises 3 times a week.  pennsaid pinkie amount topically 2 times daily as needed. Mostly to knees but can use on the back as well  Compression sleeve to the elbow and try to pad the area when resting it.   For the knees consider injections in the long run.  Xrays downstairs  Over the counter get  Vitamin D 2000 IU daily  Turmeric 500mg  daily  Tart cherry extract any dose at night  See me again in n4 weeks and if ot perfect we will drain the elbow and consider injection the knees

## 2018-01-24 NOTE — Assessment & Plan Note (Signed)
Likely more secondary to muscle imbalances.  Home exercises given.  Work with Product/process development scientist.  Discussed icing regimen.  Topical anti-inflammatories given.  X-rays ordered secondary to history of prostate cancer.  Follow-up again in 4 weeks

## 2018-01-29 ENCOUNTER — Encounter: Payer: Self-pay | Admitting: Family Medicine

## 2018-01-29 ENCOUNTER — Ambulatory Visit: Payer: Medicare Other | Admitting: Family Medicine

## 2018-01-29 ENCOUNTER — Other Ambulatory Visit: Payer: Self-pay

## 2018-01-29 VITALS — BP 132/88 | HR 66 | Temp 97.8°F | Resp 18 | Ht 68.0 in | Wt 222.4 lb

## 2018-01-29 DIAGNOSIS — J0140 Acute pansinusitis, unspecified: Secondary | ICD-10-CM

## 2018-01-29 DIAGNOSIS — I10 Essential (primary) hypertension: Secondary | ICD-10-CM | POA: Diagnosis not present

## 2018-01-29 DIAGNOSIS — R42 Dizziness and giddiness: Secondary | ICD-10-CM

## 2018-01-29 DIAGNOSIS — H409 Unspecified glaucoma: Secondary | ICD-10-CM | POA: Diagnosis not present

## 2018-01-29 MED ORDER — PENICILLIN V POTASSIUM 500 MG PO TABS: 500.0000 mg | ORAL_TABLET | Freq: Four times a day (QID) | ORAL | 0 refills | Status: AC

## 2018-01-29 MED ORDER — IPRATROPIUM BROMIDE 0.03 % NA SOLN: 2.0000 | Freq: Four times a day (QID) | NASAL | 0 refills | Status: AC

## 2018-01-29 MED ORDER — PROMETHAZINE-CODEINE 6.25-10 MG/5ML PO SYRP: 5.0000 mL | ORAL_SOLUTION | Freq: Four times a day (QID) | ORAL | 0 refills | Status: AC | PRN

## 2018-01-29 NOTE — Patient Instructions (Addendum)
   IF you received an x-ray today, you will receive an invoice from [facility]. Please contact [facility] at [phone] with questions or concerns regarding your invoice.   IF you received labwork today, you will receive an invoice from [facility]. Please contact [facility] at [phone] with questions or concerns regarding your invoice.   Our billing staff will not be able to assist you with questions regarding bills from these companies.  You will be contacted with the lab results as soon as they are available. The fastest way to get your results is to activate your My Chart account. Instructions are located on the last page of this paperwork. If you have not heard from us regarding the results in 2 weeks, please contact this office.      Sinusitis, Adult Sinusitis is soreness and inflammation of your sinuses. Sinuses are hollow spaces in the bones around your face. Your sinuses are located:  Around your eyes.  In the middle of your forehead.  Behind your nose.  In your cheekbones.  Your sinuses and nasal passages are lined with a stringy fluid (mucus). Mucus normally drains out of your sinuses. When your nasal tissues become inflamed or swollen, the mucus can become trapped or blocked so air cannot flow through your sinuses. This allows bacteria, viruses, and funguses to grow, which leads to infection. Sinusitis can develop quickly and last for 7?10 days (acute) or for more than 12 weeks (chronic). Sinusitis often develops after a cold. What are the causes? This condition is caused by anything that creates swelling in the sinuses or stops mucus from draining, including:  Allergies.  Asthma.  Bacterial or viral infection.  Abnormally shaped bones between the nasal passages.  Nasal growths that contain mucus (nasal polyps).  Narrow sinus openings.  Pollutants, such as chemicals or irritants in the air.  A foreign object stuck in the nose.  A fungal  infection. This is rare.  What increases the risk? The following factors may make you more likely to develop this condition:  Having allergies or asthma.  Having had a recent cold or respiratory tract infection.  Having structural deformities or blockages in your nose or sinuses.  Having a weak immune system.  Doing a lot of swimming or diving.  Overusing nasal sprays.  Smoking.  What are the signs or symptoms? The main symptoms of this condition are pain and a feeling of pressure around the affected sinuses. Other symptoms include:  Upper toothache.  Earache.  Headache.  Bad breath.  Decreased sense of smell and taste.  A cough that may get worse at night.  Fatigue.  Fever.  Thick drainage from your nose. The drainage is often green and it may contain pus (purulent).  Stuffy nose or congestion.  Postnasal drip. This is when extra mucus collects in the throat or back of the nose.  Swelling and warmth over the affected sinuses.  Sore throat.  Sensitivity to light.  How is this diagnosed? This condition is diagnosed based on symptoms, a medical history, and a physical exam. To find out if your condition is acute or chronic, your health care provider may:  Look in your nose for signs of nasal polyps.  Tap over the affected sinus to check for signs of infection.  View the inside of your sinuses using an imaging device that has a light attached (endoscope).  If your health care provider suspects that you have chronic sinusitis, you may also:  Be tested for allergies.    Have a sample of mucus taken from your nose (nasal culture) and checked for bacteria.  Have a mucus sample examined to see if your sinusitis is related to an allergy.  If your sinusitis does not respond to treatment and it lasts longer than 8 weeks, you may have an MRI or CT scan to check your sinuses. These scans also help to determine how severe your infection is. In rare cases, a bone  biopsy may be done to rule out more serious types of fungal sinus disease. How is this treated? Treatment for sinusitis depends on the cause and whether your condition is chronic or acute. If a virus is causing your sinusitis, your symptoms will go away on their own within 10 days. You may be given medicines to relieve your symptoms, including:  Topical nasal decongestants. They shrink swollen nasal passages and let mucus drain from your sinuses.  Antihistamines. These drugs block inflammation that is triggered by allergies. This can help to ease swelling in your nose and sinuses.  Topical nasal corticosteroids. These are nasal sprays that ease inflammation and swelling in your nose and sinuses.  Nasal saline washes. These rinses can help to get rid of thick mucus in your nose.  If your condition is caused by bacteria, you will be given an antibiotic medicine. If your condition is caused by a fungus, you will be given an antifungal medicine. Surgery may be needed to correct underlying conditions, such as narrow nasal passages. Surgery may also be needed to remove polyps. Follow these instructions at home: Medicines  Take, use, or apply over-the-counter and prescription medicines only as told by your health care provider. These may include nasal sprays.  If you were prescribed an antibiotic medicine, take it as told by your health care provider. Do not stop taking the antibiotic even if you start to feel better. Hydrate and Humidify  Drink enough water to keep your urine clear or pale yellow. Staying hydrated will help to thin your mucus.  Use a cool mist humidifier to keep the humidity level in your home above 50%.  Inhale steam for 10-15 minutes, 3-4 times a day or as told by your health care provider. You can do this in the bathroom while a hot shower is running.  Limit your exposure to cool or dry air. Rest  Rest as much as possible.  Sleep with your head raised  (elevated).  Make sure to get enough sleep each night. General instructions  Apply a warm, moist washcloth to your face 3-4 times a day or as told by your health care provider. This will help with discomfort.  Wash your hands often with soap and water to reduce your exposure to viruses and other germs. If soap and water are not available, use hand sanitizer.  Do not smoke. Avoid being around people who are smoking (secondhand smoke).  Keep all follow-up visits as told by your health care provider. This is important. Contact a health care provider if:  You have a fever.  Your symptoms get worse.  Your symptoms do not improve within 10 days. Get help right away if:  You have a severe headache.  You have persistent vomiting.  You have pain or swelling around your face or eyes.  You have vision problems.  You develop confusion.  Your neck is stiff.  You have trouble breathing. This information is not intended to replace advice given to you by your health care provider. Make sure you discuss any questions you   have with your health care provider. Document Released: 08/27/2005 Document Revised: 04/22/2016 Document Reviewed: 06/22/2015 Elsevier Interactive Patient Education  2018 Elsevier Inc.  

## 2018-01-29 NOTE — Progress Notes (Signed)
Subjective:  By signing my name below, I, [name], attest that this documentation has been prepared under the direction and in the presence of Delman Cheadle, MD. Electronically Signed: [name], [place]. 01/29/2018 , 11:37 AM .  Patient was seen in Room 1 .   Patient ID: Paul Kennedy, male    DOB: 1946-02-06, 72 y.o.   MRN: [id number] Chief Complaint  Patient presents with  . Sinus Problem    x7 days, Pt states he has a fever this morning at 100.2. Pt states he took ibuprofen. Pt states he has some sinus pressure. Pt states he has cough, sore throat, and headaches. Pt states he tried Muncinex. Pt states allergies med don't work.   HPI Paul Kennedy is a 72 y.o. male who presents to Primary Care at [facility] complaining of sinus pressure with cough, sore throat and headache. He also reported fever (Tmax 100.2) this morning, and taken ibuprofen. He's been blowing out yellow mucus from his nose. He's also coughing up out yellow phlegm, similar to the nasal mucus. He's coughing and draining at night, difficulty sleeping. He felt somewhat lightheaded this morning while on the way for his office visit today.   He's tried taking Mucinex, and allergy medications without relief. He denies recent antibiotic use. He mentions taking penicillin in the past for relief of sinusitis.   He has a history of HTN, and is controlled with diuretic. He has not used his CPAP machine since 2015. He denies history of hyperglycemia. He has history of glaucoma. He denies problems with codeine in the past.   Past Medical History:  Diagnosis Date  . Arthritis   . Glaucoma   . HTN (hypertension)   . Obesity   . OSA (obstructive sleep apnea)    cpap, pt does not know setting  . PONV (postoperative nausea and vomiting) 1980's  . Prostate cancer [facility])    Past Surgical History:  Procedure Laterality Date  . COLONOSCOPY  08/2011   repeat in 5 year,2 tubular adenomas-Dr Michail Sermon  . LYMPHADENECTOMY Bilateral 02/10/2015     Procedure: PELVIC LYMPHADENECTOMY;  Surgeon: Raynelle Bring, MD;  Location: [facility];  Service: Urology;  Laterality: Bilateral;  . PROSTATE BIOPSY    . removal of undescended testicle Right 1980's  . ROBOT ASSISTED LAPAROSCOPIC RADICAL PROSTATECTOMY N/A 02/10/2015   Procedure: ROBOTIC ASSISTED LAPAROSCOPIC RADICAL PROSTATECTOMY LEVEL 2;  Surgeon: Raynelle Bring, MD;  Location: [facility];  Service: Urology;  Laterality: N/A;  . TONSILLECTOMY  as child   Prior to Admission medications   Medication Sig Start Date End Date Taking? Authorizing Provider  COMBIGAN 0.2-0.5 % ophthalmic solution Place 1 drop into both eyes 2 (two) times daily.  01/13/15   [provider]  hydrochlorothiazide (HYDRODIURIL) 25 MG tablet Take 25 mg by mouth daily.    [provider]  HYDROcodone-acetaminophen (NORCO) 5-325 MG per tablet Take 1-2 tablets by mouth every 6 (six) hours as needed. Patient taking differently: Take 1-2 tablets by mouth every 6 (six) hours as needed for moderate pain.  02/10/15   Debbrah Alar, PA-C  potassium chloride SA (K-DUR,KLOR-CON) 20 MEQ tablet Take 20 mEq by mouth 2 (two) times daily.    [provider]   No Known Allergies Family History  Problem Relation Age of Onset  . Hypertension Mother   . Diabetes Mother        Type II  . Hypertension Sister   . Diabetes Sister        type  II  . Diabetes Sister    Social History   Socioeconomic History  . Marital status: Married    Spouse name: Not on file  . Number of children: Not on file  . Years of education: Not on file  . Highest education level: Not on file  Occupational History  . Not on file  Social Needs  . Financial resource strain: Not on file  . Food insecurity:    Worry: Not on file    Inability: Not on file  . Transportation needs:    Medical: Not on file    Non-medical: Not on file  Tobacco Use  . Smoking status: Former Smoker    Packs/day: 0.50    Years: 15.00    Pack years: 7.50     Types: Cigarettes    Last attempt to quit: 09/10/1986    Years since quitting: 31.4  . Smokeless tobacco: Former Systems developer    Types: Chew  Substance and Sexual Activity  . Alcohol use: No    Comment: 1980's hx of beer use   . Drug use: No  . Sexual activity: Yes  Lifestyle  . Physical activity:    Days per week: Not on file    Minutes per session: Not on file  . Stress: Not on file  Relationships  . Social connections:    Talks on phone: Not on file    Gets together: Not on file    Attends religious service: Not on file    Active member of club or organization: Not on file    Attends meetings of clubs or organizations: Not on file    Relationship status: Not on file  Other Topics Concern  . Not on file  Social History Narrative  . Not on file   Depression screen [facility] 2/9 01/29/2018  Decreased Interest 0  Down, Depressed, Hopeless 0  PHQ - 2 Score 0    Review of Systems  Constitutional: Positive for fever. Negative for fatigue and unexpected weight change.  HENT: Positive for sinus pressure and sore throat.   Eyes: Negative for visual disturbance.  Respiratory: Positive for cough. Negative for chest tightness and shortness of breath.   Cardiovascular: Negative for chest pain, palpitations and leg swelling.  Gastrointestinal: Negative for abdominal pain and blood in stool.  Neurological: Positive for light-headedness and headaches. Negative for dizziness.  Psychiatric/Behavioral: Positive for sleep disturbance (due to cough).       Objective:   Physical Exam  Constitutional: He is oriented to person, place, and time. He appears well-developed and well-nourished. No distress.  HENT:  Head: Normocephalic and atraumatic.  Right Ear: Tympanic membrane is injected and retracted.  Left Ear: Tympanic membrane is retracted.  Nose: Mucosal edema (R>L) present.  Mouth/Throat: Mucous membranes are pale and dry.  Nares: dry, pale and friable  Oropharynx: postnasal drip present    Eyes: Pupils are equal, round, and reactive to light. EOM are normal.  Neck: Neck supple. No thyromegaly present.  Cardiovascular: Normal rate, regular rhythm, S1 normal, S2 normal and normal heart sounds.  Pulmonary/Chest: Effort normal and breath sounds normal. No respiratory distress.  Musculoskeletal: Normal range of motion.  Lymphadenopathy:       Head (right side): Tonsillar (worse than left) adenopathy present.       Head (left side): Tonsillar adenopathy present.    He has no cervical adenopathy.  Neurological: He is alert and oriented to person, place, and time.  Skin: Skin is warm and dry.  Psychiatric: He has a normal mood and affect. His behavior is normal.  Nursing note and vitals reviewed.   BP 132/88 (BP Location: Left Arm, Patient Position: Sitting, Cuff Size: Large)   Pulse 66   Temp 97.8 F (36.6 C) (Oral)   Resp 18   Ht 5\' 8"  (1.727 m)   Wt 222 lb 6.4 oz (100.9 kg)   SpO2 97%   BMI 33.82 kg/m   Orthostatic VS for the past 24 hrs (Last 3 readings):  BP- Lying Pulse- Lying BP- Sitting Pulse- Sitting BP- Standing at 0 minutes Pulse- Standing at 0 minutes  01/29/18 1203 147/90 63 143/90 64 (!) 159/98 67        Assessment & Plan:   1. Acute non-recurrent pansinusitis   2. Episodic lightheadedness   3. Essential hypertension   4. Glaucoma, unspecified glaucoma type, unspecified laterality     Orders Placed This Encounter  Procedures  . Orthostatic vital signs    Meds ordered this encounter  Medications  . penicillin v potassium (VEETID) 500 MG tablet    Sig: Take 1 tablet (500 mg total) by mouth 4 (four) times daily.    Dispense:  28 tablet    Refill:  0  . ipratropium (ATROVENT) 0.03 % nasal spray    Sig: Place 2 sprays into the nose 4 (four) times daily.    Dispense:  30 mL    Refill:  0  . promethazine-codeine (PHENERGAN WITH CODEINE) 6.25-10 MG/5ML syrup    Sig: Take 5 mLs by mouth every 6 (six) hours as needed for cough.    Dispense:  120  mL    Refill:  0   I personally performed the services described in this documentation, which was scribed in my presence. The recorded information has been reviewed and considered, and addended by me as needed.   Delman Cheadle, M.D.  Primary Care at [facility] [address] [place], [place] [address] [phone] phone [phone] fax  06/19/18 12:51 AM

## 2018-02-25 ENCOUNTER — Ambulatory Visit: Payer: Medicare Other | Admitting: Family Medicine

## 2018-04-10 ENCOUNTER — Ambulatory Visit: Payer: Medicare Other | Admitting: Family Medicine
